# Patient Record
Sex: Male | Born: 1990 | Race: White | Hispanic: No | Marital: Single | Smoking: Current every day smoker
Health system: Southern US, Community
[De-identification: ages and names within clinical notes are randomized; demographics above are authoritative.]

## PROBLEM LIST (undated history)

## (undated) DIAGNOSIS — J45909 Unspecified asthma, uncomplicated: Secondary | ICD-10-CM

---
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
## 2011-08-22 ENCOUNTER — Ambulatory Visit: Payer: Self-pay | Admitting: Internal Medicine

## 2015-01-18 ENCOUNTER — Encounter: Payer: Self-pay | Admitting: Emergency Medicine

## 2015-01-18 ENCOUNTER — Emergency Department
Admission: EM | Admit: 2015-01-18 | Discharge: 2015-01-18 | Disposition: A | Discharge status: Home / Self Care | Payer: Self-pay | Attending: Emergency Medicine | Admitting: Emergency Medicine

## 2015-01-18 ENCOUNTER — Emergency Department: Payer: Self-pay

## 2015-01-18 DIAGNOSIS — Y9389 Activity, other specified: Secondary | ICD-10-CM | POA: Insufficient documentation

## 2015-01-18 DIAGNOSIS — Y92410 Unspecified street and highway as the place of occurrence of the external cause: Secondary | ICD-10-CM | POA: Insufficient documentation

## 2015-01-18 DIAGNOSIS — S8391XA Sprain of unspecified site of right knee, initial encounter: Secondary | ICD-10-CM

## 2015-01-18 DIAGNOSIS — Y998 Other external cause status: Secondary | ICD-10-CM | POA: Insufficient documentation

## 2015-01-18 DIAGNOSIS — F17200 Nicotine dependence, unspecified, uncomplicated: Secondary | ICD-10-CM | POA: Insufficient documentation

## 2015-01-18 HISTORY — DX: Unspecified asthma, uncomplicated: J45.909

## 2015-01-18 MED ORDER — OXYCODONE-ACETAMINOPHEN 5-325 MG PO TABS
1.0000 | ORAL_TABLET | Freq: Once | ORAL | Status: AC
  Administered 2015-01-18: 1 via ORAL
  Filled 2015-01-18: qty 1

## 2015-01-18 MED ORDER — IBUPROFEN 800 MG PO TABS: 800.0000 mg | ORAL_TABLET | Freq: Three times a day (TID) | ORAL | Status: DC | PRN

## 2015-01-18 MED ORDER — IBUPROFEN 800 MG PO TABS
800.0000 mg | ORAL_TABLET | Freq: Once | ORAL | Status: AC
  Administered 2015-01-18: 800 mg via ORAL
  Filled 2015-01-18: qty 1

## 2015-01-18 MED ORDER — TRAMADOL HCL 50 MG PO TABS: 50.0000 mg | ORAL_TABLET | Freq: Four times a day (QID) | ORAL | Status: AC | PRN
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
## 2015-01-18 NOTE — Discharge Instructions (Signed)
Wearing knee support for 3-5 days as needed. How to Use a Knee Brace A knee brace is a device that you wear to support your knee, especially if the knee is healing after an injury or surgery. There are several types of knee braces. Some are designed to prevent an injury (prophylactic brace). These are often worn during sports. Others support an injured knee (functional brace) or keep it still while it heals (rehabilitative brace). People with severe arthritis of the knee may benefit from a brace that takes some pressure off the knee (unloader brace). Most knee braces are made from a combination of cloth and metal or plastic.  You may need to wear a knee brace to:  Relieve knee pain.  Help your knee support your weight (improve stability).  Help you walk farther (improve mobility).  Prevent injury.  Support your knee while it heals from surgery or from an injury. RISKS AND COMPLICATIONS Generally, knee braces are very safe to wear. However, problems may occur, including:  Skin irritation that may lead to infection.  Making your condition worse if you wear the brace in the wrong way. HOW TO USE A KNEE BRACE Different braces will have different instructions for use. Your health care provider will tell you or show you:  How to put on your brace.  How to adjust the brace.  When and how often to wear the brace.  How to remove the brace.  If you will need any assistive devices in addition to the brace, such as crutches or a cane. In general, your brace should:  Have the hinge of the brace line up with the bend of your knee.  Have straps, hooks, or tapes that fasten snugly around your leg.  Not feel too tight or too loose. HOW TO CARE FOR A KNEE BRACE  Check your brace often for signs of damage, such as loose connections or attachments. Your knee brace may get damaged or wear out during normal use.  Wash the fabric parts of your brace with soap and water.  Read the insert that  comes with your brace for other specific care instructions. SEEK MEDICAL CARE IF:  Your knee brace is too loose or too tight and you cannot adjust it.  Your knee brace causes skin redness, swelling, bruising, or irritation.  Your knee brace is not helping.  Your knee brace is making your knee pain worse.   This information is not intended to replace advice given to you by your health care provider. Make sure you discuss any questions you have with your health care provider.   Document Released: 03/29/2003 Document Revised: 09/27/2014 Document Reviewed: 05/01/2014 Elsevier Interactive Patient Education [employer]2016 Elsevier Inc.
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
## 2015-03-26 ENCOUNTER — Emergency Department
Admission: EM | Admit: 2015-03-26 | Discharge: 2015-03-26 | Disposition: A | Discharge status: Home / Self Care | Payer: Self-pay | Attending: Emergency Medicine | Admitting: Emergency Medicine

## 2015-03-26 ENCOUNTER — Emergency Department: Payer: Self-pay

## 2015-03-26 ENCOUNTER — Encounter: Payer: Self-pay | Admitting: Emergency Medicine

## 2015-03-26 DIAGNOSIS — K2970 Gastritis, unspecified, without bleeding: Secondary | ICD-10-CM | POA: Insufficient documentation

## 2015-03-26 DIAGNOSIS — F17200 Nicotine dependence, unspecified, uncomplicated: Secondary | ICD-10-CM | POA: Insufficient documentation

## 2015-03-26 LAB — URINALYSIS COMPLETE WITH MICROSCOPIC (ARMC ONLY)
BACTERIA UA: NONE SEEN
BILIRUBIN URINE: NEGATIVE
GLUCOSE, UA: NEGATIVE mg/dL
Hgb urine dipstick: NEGATIVE
KETONES UR: NEGATIVE mg/dL
Leukocytes, UA: NEGATIVE
NITRITE: NEGATIVE
Protein, ur: NEGATIVE mg/dL
SPECIFIC GRAVITY, URINE: 1.028 (ref 1.005–1.030)
Squamous Epithelial / LPF: NONE SEEN
pH: 6 (ref 5.0–8.0)

## 2015-03-26 LAB — COMPREHENSIVE METABOLIC PANEL
ALBUMIN: 4.2 g/dL (ref 3.5–5.0)
ALK PHOS: 79 U/L (ref 38–126)
ALT: 38 U/L (ref 17–63)
AST: 22 U/L (ref 15–41)
Anion gap: 10 (ref 5–15)
BILIRUBIN TOTAL: 0.6 mg/dL (ref 0.3–1.2)
BUN: 14 mg/dL (ref 6–20)
CALCIUM: 9.1 mg/dL (ref 8.9–10.3)
CO2: 27 mmol/L (ref 22–32)
CREATININE: 1.09 mg/dL (ref 0.61–1.24)
Chloride: 103 mmol/L (ref 101–111)
GFR calc Af Amer: >60 mL/min (ref >60)
GLUCOSE: 96 mg/dL (ref 65–99)
Potassium: 3.8 mmol/L (ref 3.5–5.1)
Sodium: 140 mmol/L (ref 135–145)
TOTAL PROTEIN: 7.3 g/dL (ref 6.5–8.1)

## 2015-03-26 LAB — CBC
HCT: 53.7 % — ABNORMAL HIGH (ref 40.0–52.0)
Hemoglobin: 17.9 g/dL (ref 13.0–18.0)
MCH: 30 pg (ref 26.0–34.0)
MCHC: 33.4 g/dL (ref 32.0–36.0)
MCV: 89.9 fL (ref 80.0–100.0)
PLATELETS: 229 10*3/uL (ref 150–440)
RBC: 5.97 MIL/uL — ABNORMAL HIGH (ref 4.40–5.90)
RDW: 12.8 % (ref 11.5–14.5)
WBC: 11.3 10*3/uL — AB (ref 3.8–10.6)

## 2015-03-26 LAB — LIPASE, BLOOD: Lipase: 15 U/L (ref 11–51)

## 2015-03-26 MED ORDER — ONDANSETRON HCL 4 MG/2ML IJ SOLN
4.0000 mg | Freq: Once | INTRAMUSCULAR | Status: AC
  Administered 2015-03-26: 4 mg via INTRAVENOUS
  Filled 2015-03-26: qty 2

## 2015-03-26 MED ORDER — ONDANSETRON HCL 4 MG PO TABS: 4.0000 mg | ORAL_TABLET | Freq: Three times a day (TID) | ORAL | Status: DC | PRN

## 2015-03-26 MED ORDER — IOHEXOL 350 MG/ML SOLN
100.0000 mL | Freq: Once | INTRAVENOUS | Status: AC | PRN
  Administered 2015-03-26: 100 mL via INTRAVENOUS
  Filled 2015-03-26: qty 100

## 2015-03-26 MED ORDER — DICYCLOMINE HCL 20 MG PO TABS: 20.0000 mg | ORAL_TABLET | Freq: Three times a day (TID) | ORAL | Status: DC | PRN

## 2015-03-26 MED ORDER — PANTOPRAZOLE SODIUM 20 MG PO TBEC: 20.0000 mg | DELAYED_RELEASE_TABLET | Freq: Every day | ORAL | Status: DC

## 2015-03-26 MED ORDER — IOHEXOL 240 MG/ML SOLN
25.0000 mL | Freq: Once | INTRAMUSCULAR | Status: AC | PRN
  Administered 2015-03-26: 25 mL via ORAL
  Filled 2015-03-26: qty 25

## 2015-03-26 MED ORDER — KETOROLAC TROMETHAMINE 30 MG/ML IJ SOLN
30.0000 mg | Freq: Once | INTRAMUSCULAR | Status: AC
  Administered 2015-03-26: 30 mg via INTRAVENOUS
  Filled 2015-03-26: qty 1

## 2015-03-26 NOTE — ED Notes (Addendum)
Pt reports RUQ pain since Saturday; reports vomiting with pain and diarrhea. Pt reports taking pepto bismol without relief.

## 2015-03-26 NOTE — Discharge Instructions (Signed)
Gastritis, Adult Gastritis is soreness and swelling (inflammation) of the lining of the stomach. Gastritis can develop as a sudden onset (acute) or long-term (chronic) condition. If gastritis is not treated, it can lead to stomach bleeding and ulcers. CAUSES  Gastritis occurs when the stomach lining is weak or damaged. Digestive juices from the stomach then inflame the weakened stomach lining. The stomach lining may be weak or damaged due to viral or bacterial infections. One common bacterial infection is the Helicobacter pylori infection. Gastritis can also result from excessive alcohol consumption, taking certain medicines, or having too much acid in the stomach.  SYMPTOMS  In some cases, there are no symptoms. When symptoms are present, they may include:  Pain or a burning sensation in the upper abdomen.  Nausea.  Vomiting.  An uncomfortable feeling of fullness after eating. DIAGNOSIS  Your caregiver may suspect you have gastritis based on your symptoms and a physical exam. To determine the cause of your gastritis, your caregiver may perform the following:  Blood or stool tests to check for the H pylori bacterium.  Gastroscopy. A thin, flexible tube (endoscope) is passed down the esophagus and into the stomach. The endoscope has a light and camera on the end. Your caregiver uses the endoscope to view the inside of the stomach.  Taking a tissue sample (biopsy) from the stomach to examine under a microscope. TREATMENT  Depending on the cause of your gastritis, medicines may be prescribed. If you have a bacterial infection, such as an H pylori infection, antibiotics may be given. If your gastritis is caused by too much acid in the stomach, H2 blockers or antacids may be given. Your caregiver may recommend that you stop taking aspirin, ibuprofen, or other nonsteroidal anti-inflammatory drugs (NSAIDs). HOME CARE INSTRUCTIONS  Only take over-the-counter or prescription medicines as directed by  your caregiver.  If you were given antibiotic medicines, take them as directed. Finish them even if you start to feel better.  Drink enough fluids to keep your urine clear or pale yellow.  Avoid foods and drinks that make your symptoms worse, such as:  Caffeine or alcoholic drinks.  Chocolate.  Peppermint or mint flavorings.  Garlic and onions.  Spicy foods.  Citrus fruits, such as oranges, lemons, or limes.  Tomato-based foods such as sauce, chili, salsa, and pizza.  Fried and fatty foods.  Eat small, frequent meals instead of large meals. SEEK IMMEDIATE MEDICAL CARE IF:   You have black or dark red stools.  You vomit blood or material that looks like coffee grounds.  You are unable to keep fluids down.  Your abdominal pain gets worse.  You have a fever.  You do not feel better after 1 week.  You have any other questions or concerns. MAKE SURE YOU:  Understand these instructions.  Will watch your condition.  Will get help right away if you are not doing well or get worse.   This information is not intended to replace advice given to you by your health care provider. Make sure you discuss any questions you have with your health care provider.   Document Released: 12/31/2000 Document Revised: 07/08/2011 Document Reviewed: 02/19/2011 Elsevier Interactive Patient Education [employer]  Please return immediately if condition worsens. Please contact her primary physician or the physician you were given for referral. If you have any specialist physicians involved in her treatment and plan please also contact them. Thank you for using [facility]. Try to avoid nonsteroidal medications like  ibuprofen, Aleve, Naprosyn.

## 2015-03-26 NOTE — ED Provider Notes (Signed)
Time Seen: Approximately 1405  I have reviewed the triage notes  Chief Complaint: Abdominal Pain   History of Present Illness: Nathan Pena is a 25 y.o. male who presents with intermittent right-sided abdominal pain generally around food. Patient states that when he eats he feels nauseated occasional vomiting and gets some loose watery stool. He is not aware of any fever and currently states that his pain is more toward the right lower quadrant. Since states he took some Pepto-Bismol without relief at home. He denies any recent travel or antibiotic therapy. He denies any melena or hematochezia. He states some current nausea without persistent vomiting and states most of his issues usually seem to be around eating. The patient had half a bag of potato chips at 1300 and states that's when the pain seemed to be at its maximum. Past Medical History  Diagnosis Date  . Asthma     There are no active problems to display for this patient.   History reviewed. No pertinent past surgical history.  History reviewed. No pertinent past surgical history.  Current Outpatient Rx  Name  Route  Sig  Dispense  Refill  . ibuprofen (ADVIL,MOTRIN) 800 MG tablet   Oral   Take 1 tablet (800 mg total) by mouth every 8 (eight) hours as needed for moderate pain.   15 tablet   0   . traMADol (ULTRAM) 50 MG tablet   Oral   Take 1 tablet (50 mg total) by mouth every 6 (six) hours as needed.   20 tablet   0     Allergies:  Review of patient's allergies indicates no known allergies.  Family History: No family history on file.  Social History: Social History  Substance Use Topics  . Smoking status: Current Every Day Smoker -- 0.50 packs/day  . Smokeless tobacco: None  . Alcohol Use: Yes     Comment: occasional     Review of Systems:   10 point review of systems was performed and was otherwise negative:  Constitutional: No fever Eyes: No visual disturbances ENT: No sore throat, ear  pain Cardiac: No chest pain Respiratory: No shortness of breath, wheezing, or stridor Abdomen: Constant right-sided pain with occasional exacerbations. Endocrine: No weight loss, No night sweats Extremities: No peripheral edema, cyanosis Skin: No rashes, easy bruising Neurologic: No focal weakness, trouble with speech or swollowing Urologic: No dysuria, Hematuria, or urinary frequency No testicular pain or masses  Physical Exam:  ED Triage Vitals  Enc Vitals Group     BP 03/26/15 1144 139/81 mmHg     Pulse Rate 03/26/15 1144 67     Resp 03/26/15 1144 17     Temp 03/26/15 1144 98.2 F (36.8 C)     Temp Source 03/26/15 1144 Oral     SpO2 03/26/15 1144 95 %     Weight 03/26/15 1144 198 lb (89.812 kg)     Height 03/26/15 1144 5\' 9"  (1.753 m)     Head Cir --      Peak Flow --      Pain Score 03/26/15 1147 5     Pain Loc --      Pain Edu? --      Excl. in GC? --     General: Awake , Alert , and Oriented times 3; GCS 15 Head: Normal cephalic , atraumatic Eyes: Pupils equal , round, reactive to light Nose/Throat: No nasal drainage, patent upper airway without erythema or exudate.  Neck: Supple, Full range  of motion, No anterior adenopathy or palpable thyroid masses Lungs: Clear to ascultation without wheezes , rhonchi, or rales Heart: Regular rate, regular rhythm without murmurs , gallops , or rubs Abdomen: Patient has some mild tenderness over McBurney's point without rebound, guarding , or rigidity; bowel sounds positive and symmetric in all 4 quadrants. No organomegaly .       Negative Murphy's sign Extremities: 2 plus symmetric pulses. No edema, clubbing or cyanosis Neurologic: normal ambulation, Motor symmetric without deficits, sensory intact Skin: warm, dry, no rashes   Labs:   All laboratory work was reviewed including any pertinent negatives or positives listed below:  Labs Reviewed  CBC - Abnormal; Notable for the following:    WBC 11.3 (*)    RBC 5.97 (*)    HCT  53.7 (*)    All other components within normal limits  URINALYSIS COMPLETEWITH MICROSCOPIC ([facility] ONLY) - Abnormal; Notable for the following:    Color, Urine YELLOW (*)    APPearance CLEAR (*)    All other components within normal limits  LIPASE, BLOOD  COMPREHENSIVE METABOLIC PANEL   review laboratory work shows a negative urinalysis and a slightly elevated white blood cell count   Radiology:    EXAM: CT ABDOMEN AND PELVIS WITH CONTRAST  TECHNIQUE: Multidetector CT imaging of the abdomen and pelvis was performed using the standard protocol following bolus administration of intravenous contrast.  CONTRAST: OMNIPAQUE IOHEXOL 350 MG/ML SOLN  COMPARISON: None.  FINDINGS: Lower chest: Lung bases are clear.  Hepatobiliary: No focal hepatic lesion. No biliary duct dilatation. Gallbladder is normal. Common bile duct is normal.  Pancreas: Pancreas is normal. No ductal dilatation. No pancreatic inflammation.  Spleen: Normal spleen  Adrenals/urinary tract: Adrenal glands and kidneys are normal. The ureters and bladder normal.  Stomach/Bowel: Stomach, small bowel, appendix, and cecum are normal. The colon and rectosigmoid colon are normal.  Vascular/Lymphatic: Abdominal aorta is normal caliber. There is no retroperitoneal or periportal lymphadenopathy. No pelvic lymphadenopathy.  Reproductive: Prostate normal.  Other: No free fluid.  Musculoskeletal: No aggressive osseous lesion.  IMPRESSION: 1. Normal gallbladder and appendix. 2. No Ureterolithiasis or obstructive     I personally reviewed the radiologic studies    ED Course:  Patient's stay here was uneventful and with history of lower abdominal pain differential includes Differential diagnosis includes but is not exclusive to acute appendicitis, renal colic, testicular torsion, urinary tract infection, prostatitis,  diverticulitis, small bowel obstruction, colitis, abdominal aortic aneurysm,  gastroenteritis, etc. Given the patient's current clinical presentation and objective findings I felt it was a nonsurgical presentation and it was unlikely this was acute cholecystitis or acute appendicitis. Patient has some mild GI irritation and this may be gastroenteritis versus gastritis. Patient does smoke and is recently been on ibuprofen and took Pepto-Bismol at home. Patient was prescribed proton next, Zofran, and Bentyl on an outpatient basis and was referred to general medicine unassigned. He's been advised drink plenty of fluids and return here especially if he has blood in his stool, bloody emesis, high fever, or any other new concerns.    Assessment:  Acute unspecified abdominal pain     Plan: * Patient was advised to return immediately if condition worsens. Patient was advised to follow up with their primary care physician or other specialized physicians involved in their outpatient care Outpatient management            Jennye Moccasin, MD 03/26/15 [phone]

## 2015-03-26 NOTE — ED Notes (Signed)
Right upper abd pain that increases with food.  Tender to palpation upper right side. Ate .5 bag of potato chips approx. 1300

## 2015-06-24 ENCOUNTER — Emergency Department
Admission: EM | Admit: 2015-06-24 | Discharge: 2015-06-24 | Disposition: A | Discharge status: Home / Self Care | Payer: Self-pay | Attending: Emergency Medicine | Admitting: Emergency Medicine

## 2015-06-24 ENCOUNTER — Emergency Department: Payer: Self-pay

## 2015-06-24 ENCOUNTER — Encounter: Payer: Self-pay | Admitting: Emergency Medicine

## 2015-06-24 DIAGNOSIS — Y999 Unspecified external cause status: Secondary | ICD-10-CM | POA: Insufficient documentation

## 2015-06-24 DIAGNOSIS — F17200 Nicotine dependence, unspecified, uncomplicated: Secondary | ICD-10-CM | POA: Insufficient documentation

## 2015-06-24 DIAGNOSIS — Y939 Activity, unspecified: Secondary | ICD-10-CM | POA: Insufficient documentation

## 2015-06-24 DIAGNOSIS — J45909 Unspecified asthma, uncomplicated: Secondary | ICD-10-CM | POA: Insufficient documentation

## 2015-06-24 DIAGNOSIS — W11XXXA Fall on and from ladder, initial encounter: Secondary | ICD-10-CM | POA: Insufficient documentation

## 2015-06-24 DIAGNOSIS — S93401A Sprain of unspecified ligament of right ankle, initial encounter: Secondary | ICD-10-CM | POA: Insufficient documentation

## 2015-06-24 DIAGNOSIS — Z79899 Other long term (current) drug therapy: Secondary | ICD-10-CM | POA: Insufficient documentation

## 2015-06-24 DIAGNOSIS — S29019A Strain of muscle and tendon of unspecified wall of thorax, initial encounter: Secondary | ICD-10-CM

## 2015-06-24 DIAGNOSIS — S39012A Strain of muscle, fascia and tendon of lower back, initial encounter: Secondary | ICD-10-CM | POA: Insufficient documentation

## 2015-06-24 DIAGNOSIS — W19XXXA Unspecified fall, initial encounter: Secondary | ICD-10-CM

## 2015-06-24 DIAGNOSIS — Y929 Unspecified place or not applicable: Secondary | ICD-10-CM | POA: Insufficient documentation

## 2015-06-24 DIAGNOSIS — S29011A Strain of muscle and tendon of front wall of thorax, initial encounter: Secondary | ICD-10-CM | POA: Insufficient documentation

## 2015-06-24 MED ORDER — IBUPROFEN 800 MG PO TABS: 800.0000 mg | ORAL_TABLET | Freq: Three times a day (TID) | ORAL | Status: DC | PRN

## 2015-06-24 MED ORDER — IBUPROFEN 800 MG PO TABS
800.0000 mg | ORAL_TABLET | Freq: Once | ORAL | Status: AC
  Administered 2015-06-24: 800 mg via ORAL
  Filled 2015-06-24: qty 1

## 2015-06-24 MED ORDER — CYCLOBENZAPRINE HCL 5 MG PO TABS: 5.0000 mg | ORAL_TABLET | Freq: Three times a day (TID) | ORAL | Status: DC | PRN

## 2015-06-24 NOTE — Discharge Instructions (Signed)
Ankle Sprain °An ankle sprain is an injury to the strong, fibrous tissues (ligaments) that hold the bones of your ankle joint together.  °CAUSES °An ankle sprain is usually caused by a fall or by twisting your ankle. Ankle sprains most commonly occur when you step on the outer edge of your foot, and your ankle turns inward. People who participate in sports are more prone to these types of injuries.  °SYMPTOMS  °· Pain in your ankle. The pain may be present at rest or only when you are trying to stand or walk. °· Swelling. °· Bruising. Bruising may develop immediately or within 1 to 2 days after your injury. °· Difficulty standing or walking, particularly when turning corners or changing directions. °DIAGNOSIS  °Your caregiver will ask you details about your injury and perform a physical exam of your ankle to determine if you have an ankle sprain. During the physical exam, your caregiver will press on and apply pressure to specific areas of your foot and ankle. Your caregiver will try to move your ankle in certain ways. An X-ray exam may be done to be sure a bone was not broken or a ligament did not separate from one of the bones in your ankle (avulsion fracture).  °TREATMENT  °Certain types of braces can help stabilize your ankle. Your caregiver can make a recommendation for this. Your caregiver may recommend the use of medicine for pain. If your sprain is severe, your caregiver may refer you to a surgeon who helps to restore function to parts of your skeletal system (orthopedist) or a physical therapist. °HOME CARE INSTRUCTIONS  °· Apply ice to your injury for 1-2 days or as directed by your caregiver. Applying ice helps to reduce inflammation and pain. °· Put ice in a plastic bag. °· Place a towel between your skin and the bag. °· Leave the ice on for 15-20 minutes at a time, every 2 hours while you are awake. °· Only take over-the-counter or prescription medicines for pain, discomfort, or fever as directed by  your caregiver. °· Elevate your injured ankle above the level of your heart as much as possible for 2-3 days. °· If your caregiver recommends crutches, use them as instructed. Gradually put weight on the affected ankle. Continue to use crutches or a cane until you can walk without feeling pain in your ankle. °· If you have a plaster splint, wear the splint as directed by your caregiver. Do not rest it on anything harder than a pillow for the first 24 hours. Do not put weight on it. Do not get it wet. You may take it off to take a shower or bath. °· You may have been given an elastic bandage to wear around your ankle to provide support. If the elastic bandage is too tight (you have numbness or tingling in your foot or your foot becomes cold and blue), adjust the bandage to make it comfortable. °· If you have an air splint, you may blow more air into it or let air out to make it more comfortable. You may take your splint off at night and before taking a shower or bath. Wiggle your toes in the splint several times per day to decrease swelling. °SEEK MEDICAL CARE IF:  °· You have rapidly increasing bruising or swelling. °· Your toes feel extremely cold or you lose feeling in your foot. °· Your pain is not relieved with medicine. °SEEK IMMEDIATE MEDICAL CARE IF: °· Your toes are numb or blue. °·   You have severe pain that is increasing. MAKE SURE YOU:   Understand these instructions.  Will watch your condition.  Will get help right away if you are not doing well or get worse.   This information is not intended to replace advice given to you by your health care provider. Make sure you discuss any questions you have with your health care provider.   Document Released: 01/06/2005 Document Revised: 01/27/2014 Document Reviewed: 01/18/2011 Elsevier Interactive Patient Education 2016 [employer].  Low Back Strain With Rehab A strain is an injury in which a tendon or muscle is torn. The muscles and tendons of  the lower back are vulnerable to strains. However, these muscles and tendons are very strong and require a great force to be injured. Strains are classified into three categories. Grade 1 strains cause pain, but the tendon is not lengthened. Grade 2 strains include a lengthened ligament, due to the ligament being stretched or partially ruptured. With grade 2 strains there is still function, although the function may be decreased. Grade 3 strains involve a complete tear of the tendon or muscle, and function is usually impaired. SYMPTOMS   Pain in the lower back.  Pain that affects one side more than the other.  Pain that gets worse with movement and may be felt in the hip, buttocks, or back of the thigh.  Muscle spasms of the muscles in the back.  Swelling along the muscles of the back.  Loss of strength of the back muscles.  Crackling sound (crepitation) when the muscles are touched. CAUSES  Lower back strains occur when a force is placed on the muscles or tendons that is greater than they can handle. Common causes of injury include:  Prolonged overuse of the muscle-tendon units in the lower back, usually from incorrect posture.  A single violent injury or force applied to the back. RISK INCREASES WITH:  Sports that involve twisting forces on the spine or a lot of bending at the waist (football, rugby, weightlifting, bowling, golf, tennis, speed skating, racquetball, swimming, running, gymnastics, diving).  Poor strength and flexibility.  Failure to warm up properly before activity.  Family history of lower back pain or disk disorders.  Previous back injury or surgery (especially fusion).  Poor posture with lifting, especially heavy objects.  Prolonged sitting, especially with poor posture. PREVENTION   Learn and use proper posture when sitting or lifting (maintain proper posture when sitting, lift using the knees and legs, not at the waist).  Warm up and stretch properly  before activity.  Allow for adequate recovery between workouts.  Maintain physical fitness:  Strength, flexibility, and endurance.  Cardiovascular fitness. PROGNOSIS  If treated properly, lower back strains usually heal within 6 weeks. RELATED COMPLICATIONS   Recurring symptoms, resulting in a chronic problem.  Chronic inflammation, scarring, and partial muscle-tendon tear.  Delayed healing or resolution of symptoms.  Prolonged disability. TREATMENT  Treatment first involves the use of ice and medicine, to reduce pain and inflammation. The use of strengthening and stretching exercises may help reduce pain with activity. These exercises may be performed at home or with a therapist. Severe injuries may require referral to a therapist for further evaluation and treatment, such as ultrasound. Your caregiver may advise that you wear a back brace or corset, to help reduce pain and discomfort. Often, prolonged bed rest results in greater harm then benefit. Corticosteroid injections may be recommended. However, these should be reserved for the most serious cases. It is important  to avoid using your back when lifting objects. At night, sleep on your back on a firm mattress with a pillow placed under your knees. If non-surgical treatment is unsuccessful, surgery may be needed.  MEDICATION   If pain medicine is needed, nonsteroidal anti-inflammatory medicines (aspirin and ibuprofen), or other minor pain relievers (acetaminophen), are often advised.  Do not take pain medicine for 7 days before surgery.  Prescription pain relievers may be given, if your caregiver thinks they are needed. Use only as directed and only as much as you need.  Ointments applied to the skin may be helpful.  Corticosteroid injections may be given by your caregiver. These injections should be reserved for the most serious cases, because they may only be given a certain number of times. HEAT AND COLD  Cold treatment  (icing) should be applied for 10 to 15 minutes every 2 to 3 hours for inflammation and pain, and immediately after activity that aggravates your symptoms. Use ice packs or an ice massage.  Heat treatment may be used before performing stretching and strengthening activities prescribed by your caregiver, physical therapist, or athletic trainer. Use a heat pack or a warm water soak. SEEK MEDICAL CARE IF:   Symptoms get worse or do not improve in 2 to 4 weeks, despite treatment.  You develop numbness, weakness, or loss of bowel or bladder function.  New, unexplained symptoms develop. (Drugs used in treatment may produce side effects.) EXERCISES  RANGE OF MOTION (ROM) AND STRETCHING EXERCISES - Low Back Strain Most people with lower back pain will find that their symptoms get worse with excessive bending forward (flexion) or arching at the lower back (extension). The exercises which will help resolve your symptoms will focus on the opposite motion.  Your physician, physical therapist or athletic trainer will help you determine which exercises will be most helpful to resolve your lower back pain. Do not complete any exercises without first consulting with your caregiver. Discontinue any exercises which make your symptoms worse until you speak to your caregiver.  If you have pain, numbness or tingling which travels down into your buttocks, leg or foot, the goal of the therapy is for these symptoms to move closer to your back and eventually resolve. Sometimes, these leg symptoms will get better, but your lower back pain may worsen. This is typically an indication of progress in your rehabilitation. Be very alert to any changes in your symptoms and the activities in which you participated in the 24 hours prior to the change. Sharing this information with your caregiver will allow him/her to most efficiently treat your condition.  These exercises may help you when beginning to rehabilitate your injury. Your  symptoms may resolve with or without further involvement from your physician, physical therapist or athletic trainer. While completing these exercises, remember:  Restoring tissue flexibility helps normal motion to return to the joints. This allows healthier, less painful movement and activity.  An effective stretch should be held for at least 30 seconds.  A stretch should never be painful. You should only feel a gentle lengthening or release in the stretched tissue. FLEXION RANGE OF MOTION AND STRETCHING EXERCISES: STRETCH - Flexion, Single Knee to Chest   Lie on a firm bed or floor with both legs extended in front of you.  Keeping one leg in contact with the floor, bring your opposite knee to your chest. Hold your leg in place by either grabbing behind your thigh or at your knee.  Pull until you  feel a gentle stretch in your lower back. Hold __________ seconds.  Slowly release your grasp and repeat the exercise with the opposite side. Repeat __________ times. Complete this exercise __________ times per day.  STRETCH - Flexion, Double Knee to Chest   Lie on a firm bed or floor with both legs extended in front of you.  Keeping one leg in contact with the floor, bring your opposite knee to your chest.  Tense your stomach muscles to support your back and then lift your other knee to your chest. Hold your legs in place by either grabbing behind your thighs or at your knees.  Pull both knees toward your chest until you feel a gentle stretch in your lower back. Hold __________ seconds.  Tense your stomach muscles and slowly return one leg at a time to the floor. Repeat __________ times. Complete this exercise __________ times per day.  STRETCH - Low Trunk Rotation  Lie on a firm bed or floor. Keeping your legs in front of you, bend your knees so they are both pointed toward the ceiling and your feet are flat on the floor.  Extend your arms out to the side. This will stabilize your upper  body by keeping your shoulders in contact with the floor.  Gently and slowly drop both knees together to one side until you feel a gentle stretch in your lower back. Hold for __________ seconds.  Tense your stomach muscles to support your lower back as you bring your knees back to the starting position. Repeat the exercise to the other side. Repeat __________ times. Complete this exercise __________ times per day  EXTENSION RANGE OF MOTION AND FLEXIBILITY EXERCISES: STRETCH - Extension, Prone on Elbows   Lie on your stomach on the floor, a bed will be too soft. Place your palms about shoulder width apart and at the height of your head.  Place your elbows under your shoulders. If this is too painful, stack pillows under your chest.  Allow your body to relax so that your hips drop lower and make contact more completely with the floor.  Hold this position for __________ seconds.  Slowly return to lying flat on the floor. Repeat __________ times. Complete this exercise __________ times per day.  RANGE OF MOTION - Extension, Prone Press Ups  Lie on your stomach on the floor, a bed will be too soft. Place your palms about shoulder width apart and at the height of your head.  Keeping your back as relaxed as possible, slowly straighten your elbows while keeping your hips on the floor. You may adjust the placement of your hands to maximize your comfort. As you gain motion, your hands will come more underneath your shoulders.  Hold this position __________ seconds.  Slowly return to lying flat on the floor. Repeat __________ times. Complete this exercise __________ times per day.  RANGE OF MOTION- Quadruped, Neutral Spine   Assume a hands and knees position on a firm surface. Keep your hands under your shoulders and your knees under your hips. You may place padding under your knees for comfort.  Drop your head and point your tail bone toward the ground below you. This will round out your lower  back like an angry cat. Hold this position for __________ seconds.  Slowly lift your head and release your tail bone so that your back sags into a large arch, like an old horse.  Hold this position for __________ seconds.  Repeat this until you feel limber  in your lower back.  Now, find your "sweet spot." This will be the most comfortable position somewhere between the two previous positions. This is your neutral spine. Once you have found this position, tense your stomach muscles to support your lower back.  Hold this position for __________ seconds. Repeat __________ times. Complete this exercise __________ times per day.  STRENGTHENING EXERCISES - Low Back Strain These exercises may help you when beginning to rehabilitate your injury. These exercises should be done near your "sweet spot." This is the neutral, low-back arch, somewhere between fully rounded and fully arched, that is your least painful position. When performed in this safe range of motion, these exercises can be used for people who have either a flexion or extension based injury. These exercises may resolve your symptoms with or without further involvement from your physician, physical therapist or athletic trainer. While completing these exercises, remember:   Muscles can gain both the endurance and the strength needed for everyday activities through controlled exercises.  Complete these exercises as instructed by your physician, physical therapist or athletic trainer. Increase the resistance and repetitions only as guided.  You may experience muscle soreness or fatigue, but the pain or discomfort you are trying to eliminate should never worsen during these exercises. If this pain does worsen, stop and make certain you are following the directions exactly. If the pain is still present after adjustments, discontinue the exercise until you can discuss the trouble with your caregiver. STRENGTHENING - Deep Abdominals, Pelvic  Tilt  Lie on a firm bed or floor. Keeping your legs in front of you, bend your knees so they are both pointed toward the ceiling and your feet are flat on the floor.  Tense your lower abdominal muscles to press your lower back into the floor. This motion will rotate your pelvis so that your tail bone is scooping upwards rather than pointing at your feet or into the floor.  With a gentle tension and even breathing, hold this position for __________ seconds. Repeat __________ times. Complete this exercise __________ times per day.  STRENGTHENING - Abdominals, Crunches   Lie on a firm bed or floor. Keeping your legs in front of you, bend your knees so they are both pointed toward the ceiling and your feet are flat on the floor. Cross your arms over your chest.  Slightly tip your chin down without bending your neck.  Tense your abdominals and slowly lift your trunk high enough to just clear your shoulder blades. Lifting higher can put excessive stress on the lower back and does not further strengthen your abdominal muscles.  Control your return to the starting position. Repeat __________ times. Complete this exercise __________ times per day.  STRENGTHENING - Quadruped, Opposite UE/LE Lift   Assume a hands and knees position on a firm surface. Keep your hands under your shoulders and your knees under your hips. You may place padding under your knees for comfort.  Find your neutral spine and gently tense your abdominal muscles so that you can maintain this position. Your shoulders and hips should form a rectangle that is parallel with the floor and is not twisted.  Keeping your trunk steady, lift your right hand no higher than your shoulder and then your left leg no higher than your hip. Make sure you are not holding your breath. Hold this position __________ seconds.  Continuing to keep your abdominal muscles tense and your back steady, slowly return to your starting position. Repeat with the  opposite arm and leg. Repeat __________ times. Complete this exercise __________ times per day.  STRENGTHENING - Lower Abdominals, Double Knee Lift  Lie on a firm bed or floor. Keeping your legs in front of you, bend your knees so they are both pointed toward the ceiling and your feet are flat on the floor.  Tense your abdominal muscles to brace your lower back and slowly lift both of your knees until they come over your hips. Be certain not to hold your breath.  Hold __________ seconds. Using your abdominal muscles, return to the starting position in a slow and controlled manner. Repeat __________ times. Complete this exercise __________ times per day.  POSTURE AND BODY MECHANICS CONSIDERATIONS - Low Back Strain Keeping correct posture when sitting, standing or completing your activities will reduce the stress put on different body tissues, allowing injured tissues a chance to heal and limiting painful experiences. The following are general guidelines for improved posture. Your physician or physical therapist will provide you with any instructions specific to your needs. While reading these guidelines, remember:  The exercises prescribed by your provider will help you have the flexibility and strength to maintain correct postures.  The correct posture provides the best environment for your joints to work. All of your joints have less wear and tear when properly supported by a spine with good posture. This means you will experience a healthier, less painful body.  Correct posture must be practiced with all of your activities, especially prolonged sitting and standing. Correct posture is as important when doing repetitive low-stress activities (typing) as it is when doing a single heavy-load activity (lifting). RESTING POSITIONS Consider which positions are most painful for you when choosing a resting position. If you have pain with flexion-based activities (sitting, bending, stooping, squatting),  choose a position that allows you to rest in a less flexed posture. You would want to avoid curling into a fetal position on your side. If your pain worsens with extension-based activities (prolonged standing, working overhead), avoid resting in an extended position such as sleeping on your stomach. Most people will find more comfort when they rest with their spine in a more neutral position, neither too rounded nor too arched. Lying on a non-sagging bed on your side with a pillow between your knees, or on your back with a pillow under your knees will often provide some relief. Keep in mind, being in any one position for a prolonged period of time, no matter how correct your posture, can still lead to stiffness. PROPER SITTING POSTURE In order to minimize stress and discomfort on your spine, you must sit with correct posture. Sitting with good posture should be effortless for a healthy body. Returning to good posture is a gradual process. Many people can work toward this most comfortably by using various supports until they have the flexibility and strength to maintain this posture on their own. When sitting with proper posture, your ears will fall over your shoulders and your shoulders will fall over your hips. You should use the back of the chair to support your upper back. Your lower back will be in a neutral position, just slightly arched. You may place a small pillow or folded towel at the base of your lower back for support.  When working at a desk, create an environment that supports good, upright posture. Without extra support, muscles tire, which leads to excessive strain on joints and other tissues. Keep these recommendations in mind: CHAIR:  A chair should be  able to slide under your desk when your back makes contact with the back of the chair. This allows you to work closely.  The chair's height should allow your eyes to be level with the upper part of your monitor and your hands to be slightly  lower than your elbows. BODY POSITION  Your feet should make contact with the floor. If this is not possible, use a foot rest.  Keep your ears over your shoulders. This will reduce stress on your neck and lower back. INCORRECT SITTING POSTURES  If you are feeling tired and unable to assume a healthy sitting posture, do not slouch or slump. This puts excessive strain on your back tissues, causing more damage and pain. Healthier options include:  Using more support, like a lumbar pillow.  Switching tasks to something that requires you to be upright or walking.  Talking a brief walk.  Lying down to rest in a neutral-spine position. PROLONGED STANDING WHILE SLIGHTLY LEANING FORWARD  When completing a task that requires you to lean forward while standing in one place for a long time, place either foot up on a stationary 2-4 inch high object to help maintain the best posture. When both feet are on the ground, the lower back tends to lose its slight inward curve. If this curve flattens (or becomes too large), then the back and your other joints will experience too much stress, tire more quickly, and can cause pain. CORRECT STANDING POSTURES Proper standing posture should be assumed with all daily activities, even if they only take a few moments, like when brushing your teeth. As in sitting, your ears should fall over your shoulders and your shoulders should fall over your hips. You should keep a slight tension in your abdominal muscles to brace your spine. Your tailbone should point down to the ground, not behind your body, resulting in an over-extended swayback posture.  INCORRECT STANDING POSTURES  Common incorrect standing postures include a forward head, locked knees and/or an excessive swayback. WALKING Walk with an upright posture. Your ears, shoulders and hips should all line-up. PROLONGED ACTIVITY IN A FLEXED POSITION When completing a task that requires you to bend forward at your waist or  lean over a low surface, try to find a way to stabilize 3 out of 4 of your limbs. You can place a hand or elbow on your thigh or rest a knee on the surface you are reaching across. This will provide you more stability so that your muscles do not fatigue as quickly. By keeping your knees relaxed, or slightly bent, you will also reduce stress across your lower back. CORRECT LIFTING TECHNIQUES DO :   Assume a wide stance. This will provide you more stability and the opportunity to get as close as possible to the object which you are lifting.  Tense your abdominals to brace your spine. Bend at the knees and hips. Keeping your back locked in a neutral-spine position, lift using your leg muscles. Lift with your legs, keeping your back straight.  Test the weight of unknown objects before attempting to lift them.  Try to keep your elbows locked down at your sides in order get the best strength from your shoulders when carrying an object.  Always ask for help when lifting heavy or awkward objects. INCORRECT LIFTING TECHNIQUES DO NOT:   Lock your knees when lifting, even if it is a small object.  Bend and twist. Pivot at your feet or move your feet when needing to  change directions.  Assume that you can safely pick up even a paper clip without proper posture.   This information is not intended to replace advice given to you by your health care provider. Make sure you discuss any questions you have with your health care provider.   Document Released: 01/06/2005 Document Revised: 01/27/2014 Document Reviewed: 04/20/2008 [employer] Interactive Patient Education 2016 [employer]  Mid-Back Strain With Rehab  A strain is an injury in which a tendon or muscle is torn. The muscles and tendons of the mid-back are vulnerable to strains. However, these muscles and tendons are very strong and require a great force to be injured. The muscles of the mid-back are responsible for stabilizing the spinal column, as  well as spinal twisting (rotation). Strains are classified into three categories. Grade 1 strains cause pain, but the tendon is not lengthened. Grade 2 strains include a lengthened ligament, due to the ligament being stretched or partially ruptured. With grade 2 strains there is still function, although the function may be decreased. Grade 3 strains involve a complete tear of the tendon or muscle, and function is usually impaired. SYMPTOMS   Pain in the middle of the back.  Pain that may affect only one side, and is worse with movement.  Muscle spasms, and often swelling in the back.  Loss of strength of the back muscles.  Crackling sound (crepitation) when the muscles are touched. CAUSES  Mid-back strains occur when a force is placed on the muscles or tendons that is greater than they can handle. Common causes of injury include:  Ongoing overuse of the muscle-tendon units in the middle back, usually from incorrect body posture.  A single violent injury or force applied to the back. RISK INCREASES WITH:  Sports that involve twisting forces on the spine or a lot of bending at the waist (football, rugby, weightlifting, bowling, golf, tennis, speed skating, racquetball, swimming, running, gymnastics, diving).  Poor strength and flexibility.  Failure to warm up properly before activity.  Family history of low back pain or disk disorders.  Previous back injury or surgery (especially fusion). PREVENTION  Learn and use proper sports technique.  Warm up and stretch properly before activity.  Allow for adequate recovery between workouts.  Maintain physical fitness:  Strength, flexibility, and endurance.  Cardiovascular fitness. PROGNOSIS  If treated properly, mid-back strains usually heal within 6 weeks. RELATED COMPLICATIONS   Frequently recurring symptoms, resulting in a chronic problem. Properly treating the problem the first time decreases frequency of recurrence.  Chronic  inflammation, scarring, and partial muscle-tendon tear.  Delayed healing or resolution of symptoms, especially if activity is resumed too soon.  Prolonged disability. TREATMENT Treatment first involves the use of ice and medicine, to reduce pain and inflammation. As the pain begins to subside, you may begin strengthening and stretching exercises to improve body posture and sport technique. These exercises may be performed at home or with a therapist. Severe injuries may require referral to a therapist for further evaluation and treatment, such as ultrasound. Corticosteroid injections may be given to help reduce inflammation. Biofeedback (watching monitors of your body processes) and psychotherapy may also be prescribed. Prolonged bed rest is felt to do more harm than good. Massage may help break the muscle spasms. Sometimes, an injection of cortisone, with or without local anesthetics, may be given to help relieve the pain and spasms. MEDICATION   If pain medicine is needed, nonsteroidal anti-inflammatory medicines (aspirin and ibuprofen), or other minor pain relievers (acetaminophen),  are often advised.  Do not take pain medicine for 7 days before surgery.  Prescription pain relievers may be given, if your caregiver thinks they are needed. Use only as directed and only as much as you need.  Ointments applied to the skin may be helpful.  Corticosteroid injections may be given by your caregiver. These injections should be reserved for the most serious cases, because they may only be given a certain number of times. HEAT AND COLD:   Cold treatment (icing) should be applied for 10 to 15 minutes every 2 to 3 hours for inflammation and pain, and immediately after activity that aggravates your symptoms. Use ice packs or an ice massage.  Heat treatment may be used before performing stretching and strengthening activities prescribed by your caregiver, physical therapist, or athletic trainer. Use a heat  pack or a warm water soak. SEEK IMMEDIATE MEDICAL CARE IF:  Symptoms get worse or do not improve in 2 to 4 weeks, despite treatment.  You develop numbness, weakness, or loss of bowel or bladder function.  New, unexplained symptoms develop. (Drugs used in treatment may produce side effects.) EXERCISES RANGE OF MOTION (ROM) AND STRETCHING EXERCISES - Mid-Back Strain These exercises may help you when beginning to rehabilitate your injury. In order to successfully resolve your symptoms, you must improve your posture. These exercises are designed to help reduce the forward-head and rounded-shoulder posture which contributes to this condition. Your symptoms may resolve with or without further involvement from your physician, physical therapist or athletic trainer. While completing these exercises, remember:   Restoring tissue flexibility helps normal motion to return to the joints. This allows healthier, less painful movement and activity.  An effective stretch should be held for at least 30 seconds.  A stretch should never be painful. You should only feel a gentle lengthening or release in the stretched tissue. STRETCH - Axial Extension  Stand or sit on a firm surface. Assume a good posture: chest up, shoulders drawn back, stomach muscles slightly tense, knees unlocked (if standing) and feet hip width apart.  Slowly retract your chin, so your head slides back and your chin slightly lowers. Continue to look straight ahead.  You should feel a gentle stretch in the back of your head. Be certain not to feel an aggressive stretch since this can cause headaches later.  Hold for __________ seconds. Repeat __________ times. Complete this exercise __________ times per day. RANGE OF MOTION- Upper Thoracic Extension  Sit on a firm chair with a high back. Assume a good posture: chest up, shoulders drawn back, abdominal muscles slightly tense, and feet hip width apart. Place a small pillow or folded towel  in the curve of your lower back, if you are having difficulty maintaining good posture.  Gently brace your neck with your hands, allowing your arms to rest on your chest.  Continue to support your neck and slowly extend your back over the chair. You will feel a stretch across your upper back.  Hold __________ seconds. Slowly return to the starting position. Repeat __________ times. Complete this exercise __________ times per day. RANGE OF MOTION- Mid-Thoracic Extension  Roll a towel so that it is about 4 inches in diameter.  Position the towel lengthwise. Lay on the towel so that your spine, but not your shoulder blades, are supported.  You should feel your mid-back arching toward the floor. To increase the stretch, extend your arms away from your body.  Hold for __________ seconds. Repeat exercise __________ times,  __________ times per day. STRENGTHENING EXERCISES - Mid-Back Strain These exercises may help you when beginning to rehabilitate your injury. They may resolve your symptoms with or without further involvement from your physician, physical therapist or athletic trainer. While completing these exercises, remember:   Muscles can gain both the endurance and the strength needed for everyday activities through controlled exercises.  Complete these exercises as instructed by your physician, physical therapist or athletic trainer. Increase the resistance and repetitions only as guided by your caregiver.  You may experience muscle soreness or fatigue, but the pain or discomfort you are trying to eliminate should never worsen during these exercises. If this pain does worsen, stop and make certain you are following the directions exactly. If the pain is still present after adjustments, discontinue the exercise until you can discuss the trouble with your caregiver. STRENGTHENING - Quadruped, Opposite UE/LE Lift  Assume a hands and knees position on a firm surface. Keep your hands under  your shoulders and your knees under your hips. You may place padding under your knees for comfort.  Find your neutral spine and gently tense your abdominal muscles so that you can maintain this position. Your shoulders and hips should form a rectangle that is parallel with the floor and is not twisted.  Keeping your trunk steady, lift your right hand no higher than your shoulder and then your left leg no higher than your hip. Make sure you are not holding your breath. Hold this position __________ seconds.  Continuing to keep your abdominal muscles tense and your back steady, slowly return to your starting position. Repeat with the opposite arm and leg. Repeat __________ times. Complete this exercise __________ times per day.  STRENGTH - Shoulder Extensors  Secure a rubber exercise band or tubing to a fixed object (table, pole) so that it is at the height of your shoulders when you are either standing, or sitting on a firm armless chair.  With a thumbs-up grip, grasp an end of the band in each hand. Straighten your elbows and lift your hands straight in front of you at shoulder height. Step back away from the secured end of band, until it becomes tense.  Squeezing your shoulder blades together, pull your hands down to the sides of your thighs. Do not allow your hands to go behind you.  Hold for __________ seconds. Slowly ease the tension on the band, as you reverse the directions and return to the starting position. Repeat __________ times. Complete this exercise __________ times per day.  STRENGTH - Horizontal Abductors Choose one of the two positions to complete this exercise. Prone: lying on stomach:  Lie on your stomach on a firm surface so that your right / left arm overhangs the edge. Rest your forehead on your opposite forearm. With your palm facing the floor and your elbow straight, hold a __________ weight in your hand.  Squeeze your right / left shoulder blade to your mid-back spine  and then slowly raise your arm to the height of the bed.  Hold for __________ seconds. Slowly reverse the directions and return to the starting position, controlling the weight as you lower your arm. Repeat __________ times. Complete this exercise __________ times per day. Standing:   Secure a rubber exercise band or tubing, so that it is at the height of your shoulders when you are either standing, or sitting on a firm armless chair.  Grasp an end of the band in each hand and have your palms face  each other. Straighten your elbows and lift your hands straight in front of you at shoulder height. Step back away from the secured end of band, until it becomes tense.  Squeeze your shoulder blades together. Keeping your elbows locked and your hands at shoulder height, spread your arms apart, forming a "T" shape with your body. Hold __________ seconds. Slowly ease the tension on the band, as you reverse the directions and return to the starting position. Repeat __________ times. Complete this exercise __________ times per day. STRENGTH - Scapular Retractors and External Rotators, Rowing  Secure a rubber exercise band or tubing, so that it is at the height of your shoulders when you are either standing, or sitting on a firm armless chair.  With a palm-down grip, grasp an end of the band in each hand. Straighten your elbows and lift your hands straight in front of you at shoulder height. Step back away from the secured end of band, until it becomes tense.  Step 1: Squeeze your shoulder blades together. Bending your elbows, draw your hands to your chest as if you are rowing a boat. At the end of this motion, your hands and elbow should be at shoulder height and your elbows should be out to your sides.  Step 2: Rotate your shoulder to raise your hands above your head. Your forearms should be vertical and your upper arms should be horizontal.  Hold for __________ seconds. Slowly ease the tension on the  band, as you reverse the directions and return to the starting position. Repeat __________ times. Complete this exercise __________ times per day.  POSTURE AND BODY MECHANICS CONSIDERATIONS - Mid-Back Strain Keeping correct posture when sitting, standing or completing your activities will reduce the stress put on different body tissues, allowing injured tissues a chance to heal and limiting painful experiences. The following are general guidelines for improved posture. Your physician or physical therapist will provide you with any instructions specific to your needs. While reading these guidelines, remember:  The exercises prescribed by your provider will help you have the flexibility and strength to maintain correct postures.  The correct posture provides the best environment for your joints to work. All of your joints have less wear and tear when properly supported by a spine with good posture. This means you will experience a healthier, less painful body.  Correct posture must be practiced with all of your activities, especially prolonged sitting and standing. Correct posture is as important when doing repetitive low-stress activities (typing) as it is when doing a single heavy-load activity (lifting). PROPER SITTING POSTURE In order to minimize stress and discomfort on your spine, you must sit with correct posture. Sitting with good posture should be effortless for a healthy body. Returning to good posture is a gradual process. Many people can work toward this most comfortably by using various supports until they have the flexibility and strength to maintain this posture on their own. When sitting with proper posture, your ears will fall over your shoulders and your shoulders will fall over your hips. You should use the back of the chair to support your upper back. Your lower back will be in a neutral position, just slightly arched. You may place a small pillow or folded towel at the base of your low  back for  support.  When working at a desk, create an environment that supports good, upright posture. Without extra support, muscles fatigue and lead to excessive strain on joints and other tissues. Keep these recommendations  in mind: CHAIR:  A chair should be able to slide under your desk when your back makes contact with the back of the chair. This allows you to work closely.  The chair's height should allow your eyes to be level with the upper part of your monitor and your hands to be slightly lower than your elbows. BODY POSITION  Your feet should make contact with the floor. If this is not possible, use a foot rest.  Keep your ears over your shoulders. This will reduce stress on your neck and lower back. INCORRECT SITTING POSTURES If you are feeling tired and unable to assume a healthy sitting posture, do not slouch or slump. This puts excessive strain on your back tissues, causing more damage and pain. Healthier options include:  Using more support, like a lumbar pillow.  Switching tasks to something that requires you to be upright or walking.  Talking a brief walk.  Lying down to rest in a neutral-spine position. CORRECT STANDING POSTURES Proper standing posture should be assumed with all daily activities, even if they only take a few moments, like when brushing your teeth. As in sitting, your ears should fall over your shoulders and your shoulders should fall over your hips. You should keep a slight tension in your abdominal muscles to brace your spine. Your tailbone should point down to the ground, not behind your body, resulting in an over-extended swayback posture.  INCORRECT STANDING POSTURES Common incorrect standing postures include a forward head, locked knees, and an excessive swayback. WALKING Walk with an upright posture. Your ears, shoulders and hips should all line-up. CORRECT LIFTING TECHNIQUES DO :   Assume a wide stance. This will provide you more stability and  the opportunity to get as close as possible to the object which you are lifting.  Tense your abdominals to brace your spine. Bend at the knees and hips. Keeping your back locked in a neutral-spine position, lift using your leg muscles. Lift with your legs, keeping your back straight.  Test the weight of unknown objects before attempting to lift them.  Try to keep your elbows locked down at your sides in order get the best strength from your shoulders when carrying an object.  Always ask for help when lifting heavy or awkward objects. INCORRECT LIFTING TECHNIQUES DO NOT:   Lock your knees when lifting, even if it is a small object.  Bend and twist. Pivot at your feet or move your feet when needing to change directions.  Assume that you can safely pick up even a paperclip without proper posture.   This information is not intended to replace advice given to you by your health care provider. Make sure you discuss any questions you have with your health care provider.   Document Released: 01/06/2005 Document Revised: 05/23/2014 Document Reviewed: 04/20/2008 [employer] Interactive Patient Education [employer]

## 2015-06-24 NOTE — ED Provider Notes (Signed)
CSN: [id number]     Arrival date & time 06/24/15  1525 History   First MD Initiated Contact with Patient 06/24/15 1558     Chief Complaint  Patient presents with  . Fall     (Consider location/radiation/quality/duration/timing/severity/associated sxs/prior Treatment) HPI  25 year old male presents to the emergency department for evaluation of right ankle and back pain. Just prior to arrival he was on a ladder approximately 6 feet high, fell backwards landing on his back. Denies any headache, loss of consciousness, nausea or vomiting. He complains of 8 out of 10 and mostly in his lower midline lumbar spine and along the lateral aspect of the right ankle. He has mild pain in his midline thoracic spine, denies any other pain or injuries to his body. He has not had any medications for pain. He is able to ambulate but with a limp due to pain in the right ankle. Denies any numbness or tingling in the upper or lower extremities.  Past Medical History  Diagnosis Date  . Asthma    History reviewed. No pertinent past surgical history. No family history on file. Social History  Substance Use Topics  . Smoking status: Current Every Day Smoker -- 0.50 packs/day  . Smokeless tobacco: None  . Alcohol Use: Yes     Comment: occasional    Review of Systems  Constitutional: Negative.  Negative for fever, chills, activity change and appetite change.  HENT: Negative for congestion, ear pain, mouth sores, rhinorrhea, sinus pressure, sore throat and trouble swallowing.   Eyes: Negative for photophobia, pain and discharge.  Respiratory: Negative for cough, chest tightness and shortness of breath.   Cardiovascular: Negative for chest pain and leg swelling.  Gastrointestinal: Negative for nausea, vomiting, abdominal pain, diarrhea and abdominal distention.  Genitourinary: Negative for dysuria and difficulty urinating.  Musculoskeletal: Positive for back pain, joint swelling and gait problem. Negative for  arthralgias and neck pain.  Skin: Negative for color change and rash.  Neurological: Negative for dizziness and headaches.  Hematological: Negative for adenopathy.  Psychiatric/Behavioral: Negative for behavioral problems and agitation.      Allergies  Review of patient's allergies indicates no known allergies.  Home Medications   Prior to Admission medications   Medication Sig Start Date End Date Taking? Authorizing Provider  cyclobenzaprine (FLEXERIL) 5 MG tablet Take 1 tablet (5 mg total) by mouth every 8 (eight) hours as needed for muscle spasms. 06/24/15   Evon Slackhomas C Gaines, PA-C  dicyclomine (BENTYL) 20 MG tablet Take 1 tablet (20 mg total) by mouth 3 (three) times daily as needed for spasms. 03/26/15   Jennye MoccasinBrian S Quigley, MD  ibuprofen (ADVIL,MOTRIN) 800 MG tablet Take 1 tablet (800 mg total) by mouth every 8 (eight) hours as needed for moderate pain. 01/18/15   Joni Reiningonald K Smith, PA-C  ibuprofen (ADVIL,MOTRIN) 800 MG tablet Take 1 tablet (800 mg total) by mouth every 8 (eight) hours as needed. 06/24/15   Evon Slackhomas C Gaines, PA-C  ondansetron (ZOFRAN) 4 MG tablet Take 1 tablet (4 mg total) by mouth every 8 (eight) hours as needed for nausea or vomiting. 03/26/15   Jennye MoccasinBrian S Quigley, MD  pantoprazole (PROTONIX) 20 MG tablet Take 1 tablet (20 mg total) by mouth daily. 03/26/15 03/25/16  Jennye MoccasinBrian S Quigley, MD  traMADol (ULTRAM) 50 MG tablet Take 1 tablet (50 mg total) by mouth every 6 (six) hours as needed. 01/18/15 01/18/16  Joni Reiningonald K Smith, PA-C   BP 142/92 mmHg  Pulse 74  Temp(Src) 97.9 F (  36.6 C) (Oral)  Resp 18  Ht 5\' 10"  (1.778 m)  Wt 90.719 kg  BMI 28.70 kg/m2  SpO2 98% Physical Exam  Constitutional: He is oriented to person, place, and time. He appears well-developed and well-nourished.  HENT:  Head: Normocephalic and atraumatic.  Eyes: Conjunctivae and EOM are normal. Pupils are equal, round, and reactive to light.  Neck: Normal range of motion. Neck supple.  Cardiovascular: Normal rate,  regular rhythm, normal heart sounds and intact distal pulses.   Pulmonary/Chest: Effort normal and breath sounds normal. No respiratory distress. He has no wheezes. He has no rales. He exhibits no tenderness.  Abdominal: Soft. Bowel sounds are normal. He exhibits no distension and no mass. There is no tenderness. There is no rebound and no guarding.  Musculoskeletal:  Examination cervical spine shows patient has no spinous process tenderness. There is no left and right paravertebral muscle tenderness he has no pain with neck range of motion.Examination of the upper extremity shows full range of motion with active and passive range of motion at tenderness palpation along the shoulders elbows and wrists and digits.  Examination of the thoracic and lumbar spine shows patient has mild spinous process tenderness mostly at the lower thoracic spine and lumbosacral junction. He has mild left and right paravertebral muscle tenderness along the lumbar spine. There is no pelvic tenderness or pain with compression. He has full range of motion of the left and right hips with internal and external rotation. He has no pain with knee range of motion. He is tender along the right lateral ankle to palpation along the lateral malleolus. There is mild swelling along the lateral ankle. He has good active ankle plantarflexion and dorsiflexion of the right ankle. 2+ dorsalis pedis pulses bilaterally. No skin breakdown noted.  Lymphadenopathy:    He has no cervical adenopathy.  Neurological: He is alert and oriented to person, place, and time. No cranial nerve deficit. Coordination normal.  Skin: Skin is warm and dry.  Psychiatric: He has a normal mood and affect. His behavior is normal. Judgment and thought content normal.    ED Course  Procedures (including critical care time) Labs Review Labs Reviewed - No data to display  Imaging Review Dg Thoracic Spine 2 View  06/24/2015  CLINICAL DATA:  Larey Seat off ladder today.  Thoracic back injury and pain. Initial encounter. EXAM: THORACIC SPINE 2 VIEWS COMPARISON:  None. FINDINGS: There is no evidence of thoracic spine fracture. Alignment is normal. No other significant bone abnormalities are identified. IMPRESSION: Negative. Electronically Signed   By: Myles Rosenthal M.D.   On: 06/24/2015 17:13   Dg Lumbar Spine Complete  06/24/2015  CLINICAL DATA:  Larey Seat off ladder today. Low back injury and pain. Initial encounter. EXAM: LUMBAR SPINE - COMPLETE 4+ VIEW COMPARISON:  None. FINDINGS: There is no evidence of lumbar spine fracture. Alignment is normal. Intervertebral disc spaces are maintained. No evidence of facet arthropathy or other bone lesions. IMPRESSION: Negative lumbar spine radiographs. Electronically Signed   By: Myles Rosenthal M.D.   On: 06/24/2015 17:14   Dg Ankle Complete Right  06/24/2015  CLINICAL DATA:  Larey Seat off ladder today. Lateral ankle pain and swelling. Initial encounter. EXAM: RIGHT ANKLE - COMPLETE 3+ VIEW COMPARISON:  None. FINDINGS: There is no evidence of acute fracture, dislocation, or joint effusion. There is no evidence of arthropathy or other focal bone abnormality. Soft tissues are unremarkable. IMPRESSION: No acute findings. Electronically Signed   By: Alver Sorrow.D.  On: 06/24/2015 16:59   I have personally reviewed and evaluated these images and lab results as part of my medical decision-making.   EKG Interpretation None      MDM   Final diagnoses:  Right ankle sprain, initial encounter  Fall, initial encounter  Lumbar strain, initial encounter  Thoracic myofascial strain, initial encounter    25 year old male with fall. No head injury, headache, loss of consciousness. Complaining of mid and lower back pain along with right ankle pain. X-rays negative in the thoracic spine, lumbar spine, right ankle. Patient's given crutches and an ankle brace. Ibuprofen and Flexeril as needed for pain. Follow-up with orthopedics if no improvement in 5-7  days. Return to the ER for any worsening symptoms urgent changes in his health.    Evon Slack, PA-C 06/24/15 1742  Jeanmarie Plant, MD 06/24/15 2233

## 2015-06-24 NOTE — ED Notes (Signed)
States he was coming down a ladder and missed a step  Fell approx 6 ft backwards. Landed on back  Having lower back pain  Also states his right foot got hung up in ladder  Right ankle swollen and tender positive pulses and good circulation

## 2015-11-05 ENCOUNTER — Encounter: Payer: Self-pay | Admitting: Emergency Medicine

## 2015-11-05 ENCOUNTER — Emergency Department: Payer: Self-pay

## 2015-11-05 ENCOUNTER — Emergency Department
Admission: EM | Admit: 2015-11-05 | Discharge: 2015-11-05 | Disposition: A | Discharge status: Home / Self Care | Payer: Self-pay | Attending: Emergency Medicine | Admitting: Emergency Medicine

## 2015-11-05 DIAGNOSIS — Z791 Long term (current) use of non-steroidal anti-inflammatories (NSAID): Secondary | ICD-10-CM | POA: Insufficient documentation

## 2015-11-05 DIAGNOSIS — F17200 Nicotine dependence, unspecified, uncomplicated: Secondary | ICD-10-CM | POA: Insufficient documentation

## 2015-11-05 DIAGNOSIS — Z79899 Other long term (current) drug therapy: Secondary | ICD-10-CM | POA: Insufficient documentation

## 2015-11-05 DIAGNOSIS — J45909 Unspecified asthma, uncomplicated: Secondary | ICD-10-CM | POA: Insufficient documentation

## 2015-11-05 DIAGNOSIS — Y939 Activity, unspecified: Secondary | ICD-10-CM | POA: Insufficient documentation

## 2015-11-05 DIAGNOSIS — Y92009 Unspecified place in unspecified non-institutional (private) residence as the place of occurrence of the external cause: Secondary | ICD-10-CM | POA: Insufficient documentation

## 2015-11-05 DIAGNOSIS — Y999 Unspecified external cause status: Secondary | ICD-10-CM | POA: Insufficient documentation

## 2015-11-05 DIAGNOSIS — S300XXA Contusion of lower back and pelvis, initial encounter: Secondary | ICD-10-CM | POA: Insufficient documentation

## 2015-11-05 DIAGNOSIS — W109XXA Fall (on) (from) unspecified stairs and steps, initial encounter: Secondary | ICD-10-CM | POA: Insufficient documentation

## 2015-11-05 MED ORDER — OXYCODONE-ACETAMINOPHEN 5-325 MG PO TABS: 1.0000 | ORAL_TABLET | ORAL | 0 refills | Status: DC | PRN

## 2015-11-05 MED ORDER — DIAZEPAM 2 MG PO TABS: 2.0000 mg | ORAL_TABLET | Freq: Three times a day (TID) | ORAL | 0 refills | Status: DC | PRN

## 2015-11-05 MED ORDER — DIAZEPAM 2 MG PO TABS
2.0000 mg | ORAL_TABLET | Freq: Once | ORAL | Status: AC
  Administered 2015-11-05: 2 mg via ORAL
  Filled 2015-11-05: qty 1

## 2015-11-05 MED ORDER — OXYCODONE-ACETAMINOPHEN 5-325 MG PO TABS
1.0000 | ORAL_TABLET | Freq: Once | ORAL | Status: AC
Start: 2015-11-05 — End: 2015-11-05
  Administered 2015-11-05: 1 via ORAL
  Filled 2015-11-05: qty 1

## 2015-11-05 NOTE — ED Provider Notes (Signed)
[facility] Emergency Department Provider Note  ____________________________________________   First MD Initiated Contact with Patient 11/05/15 1227     (approximate)  I have reviewed the triage vital signs and the nursing notes.   HISTORY  Chief Complaint Back Pain   HPI Nathan Pena is a 25 y.o. male is here with complaint of back pain after falling down steps last evening at home care as they were went. Patient states that he fell backwards landing on his lower back. He denies any paresthesias or hematuria. Patient is not taking any over-the-counter medication for his pain prior to his arrival in the emergency room. 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 Patient with his pain is 7 out of 10 at this time.   Past Medical History:  Diagnosis Date  . Asthma     There are no active problems to display for this patient.   History reviewed. No pertinent surgical history.  Prior to Admission medications   Medication Sig Start Date End Date Taking? Authorizing Provider  cyclobenzaprine (FLEXERIL) 5 MG tablet Take 1 tablet (5 mg total) by mouth every 8 (eight) hours as needed for muscle spasms. 06/24/15   Evon Slack, PA-C  diazepam (VALIUM) 2 MG tablet Take 1 tablet (2 mg total) by mouth every 8 (eight) hours as needed for muscle spasms. 11/05/15   Tommi Rumps, PA-C  dicyclomine (BENTYL) 20 MG tablet Take 1 tablet (20 mg total) by mouth 3 (three) times daily as needed for spasms. 03/26/15   Jennye Moccasin, MD  ibuprofen (ADVIL,MOTRIN) 800 MG tablet Take 1 tablet (800 mg total) by mouth every 8 (eight) hours as needed for moderate pain. 01/18/15   Joni Reining, PA-C  ibuprofen (ADVIL,MOTRIN) 800 MG tablet Take 1 tablet (800 mg total) by mouth every 8 (eight) hours as needed. 06/24/15   Evon Slack, PA-C  ondansetron  (ZOFRAN) 4 MG tablet Take 1 tablet (4 mg total) by mouth every 8 (eight) hours as needed for nausea or vomiting. 03/26/15   Jennye Moccasin, MD  oxyCODONE-acetaminophen (PERCOCET) 5-325 MG tablet Take 1 tablet by mouth every 4 (four) hours as needed for severe pain. 11/05/15   Tommi Rumps, PA-C  pantoprazole (PROTONIX) 20 MG tablet Take 1 tablet (20 mg total) by mouth daily. 03/26/15 03/25/16  Jennye Moccasin, MD  traMADol (ULTRAM) 50 MG tablet Take 1 tablet (50 mg total) by mouth every 6 (six) hours as needed. 01/18/15 01/18/16  Joni Reining, PA-C    Allergies Review of patient's allergies indicates no known allergies.  History reviewed. No pertinent family history.  Social History Social History  Substance Use Topics  . Smoking status: Current Every Day Smoker    Packs/day: 0.50  . Smokeless tobacco: Never Used  . Alcohol use Yes     Comment: occasional    Review of Systems Constitutional: No fever/chills Eyes: No visual changes. ENT: No trauma Cardiovascular: Denies chest pain. Respiratory: Denies shortness of breath. Gastrointestinal: No abdominal pain.  No nausea, no vomiting.  Genitourinary: Negative for hematuria Musculoskeletal: Positive for low back pain. Skin: Negative for rash. Neurological: Negative for headaches, focal weakness or numbness.  10-point ROS otherwise negative.  ____________________________________________   PHYSICAL EXAM:  VITAL SIGNS: ED Triage Vitals  Enc Vitals Group     BP 11/05/15  1212 132/74     Pulse Rate 11/05/15 1212 67     Resp 11/05/15 1212 16     Temp 11/05/15 1212 97.4 F (36.3 C)     Temp Source 11/05/15 1212 Oral     SpO2 11/05/15 1212 100 %     Weight --      Height --      Head Circumference --      Peak Flow --      Pain Score 11/05/15 1203 7     Pain Loc --      Pain Edu? --      Excl. in GC? --     Constitutional: Alert and oriented. Well appearing and in no acute distress. Eyes: Conjunctivae are normal.  PERRL. EOMI. Head: Atraumatic. Nose: No congestion/rhinnorhea. Neck: No stridor.  No cervical spine tenderness on palpation posteriorly. Cardiovascular: Normal rate, regular rhythm. Grossly normal heart sounds.  Good peripheral circulation. Respiratory: Normal respiratory effort.  No retractions. Lungs CTAB. Musculoskeletal: On examination of the back there is no gross deformity noted. There is moderate tenderness on palpation of the paravertebral muscles lumbar area approximately L5-S1. There is no gross deformity noted. There is tenderness on palpation of the vertebral bodies. No ecchymosis or abrasions were seen. Reflexes were 2+. Patient is ambulatory without assistance. Neurologic:  Normal speech and language. No gross focal neurologic deficits are appreciated. No gait instability. Skin:  Skin is warm, dry and intact. As noted above. Psychiatric: Mood and affect are normal. Speech and behavior are normal.  ____________________________________________   LABS (all labs ordered are listed, but only abnormal results are displayed)  Labs Reviewed - No data to display  RADIOLOGY Lumbar spine x-ray per radiologist is negative for fracture. I, Tommi Rumpshonda L Summers, personally viewed and evaluated these images (plain radiographs) as part of my medical decision making, as well as reviewing the written report by the radiologist.  ____________________________________________   PROCEDURES  Procedure(s) performed: None  Procedures  Critical Care performed: No  ____________________________________________   INITIAL IMPRESSION / ASSESSMENT AND PLAN / ED COURSE  Pertinent labs & imaging results that were available during my care of the patient were reviewed by me and considered in my medical decision making (see chart for details).    Clinical Course  Patient was given Percocet and Valium 2 mg while in the emergency room and was more called prior to discharge. Patient was made aware that  he did not have any fractures. Patient is to follow-up with his primary care doctor or Dr. Hyacinth MeekerMiller if any continued problems with his back. Patient was also given a note to remain out of work for 2 days and he acknowledges that he cannot drive while taking pain medication.   ____________________________________________   FINAL CLINICAL IMPRESSION(S) / ED DIAGNOSES  Final diagnoses:  Contusion of lower back, initial encounter      NEW MEDICATIONS STARTED DURING THIS VISIT:  Discharge Medication List as of 11/05/2015  2:37 PM    START taking these medications   Details  diazepam (VALIUM) 2 MG tablet Take 1 tablet (2 mg total) by mouth every 8 (eight) hours as needed for muscle spasms., Starting Mon 11/05/2015, Print    oxyCODONE-acetaminophen (PERCOCET) 5-325 MG tablet Take 1 tablet by mouth every 4 (four) hours as needed for severe pain., Starting Mon 11/05/2015, Print         Note:  This document was prepared using Dragon voice recognition software and may include unintentional dictation errors.  Tommi Rumps, PA-C 11/05/15 1532    Emily Filbert, MD 11/06/15 [phone]

## 2015-11-05 NOTE — ED Triage Notes (Signed)
Pt to ed with c/o back pain after falling down steps last night at home. Pt reports the steps were slick and that caused him to slip.

## 2016-05-13 IMAGING — CT CT ABD-PELV W/ CM
1 of 2 series · 15 of 32 positions shown, 19 images · IV contrast (omnipaque)
Comparison: None.

CLINICAL DATA: RIGHT upper quadrant pain since [REDACTED]. Vomiting
and diarrhea

EXAM:
CT ABDOMEN AND PELVIS WITH CONTRAST
TECHNIQUE: Multidetector CT imaging of the abdomen and pelvis was performed
using the standard protocol following bolus administration of
intravenous contrast.
CONTRAST:  100mL OMNIPAQUE IOHEXOL 350 MG/ML SOLN

[Series 2: routine abd pel with · axial · 0.89mm/px · z∈[-1112,-647]mm · 15 of 103 slices shown, 19 images]
[im 5/103  soft-tissue]
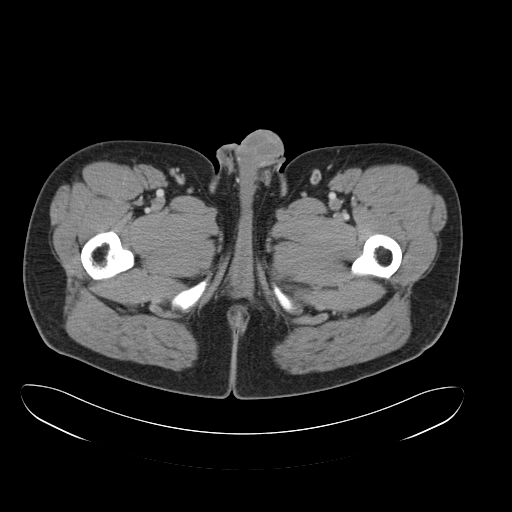
[im 5/103  bone]
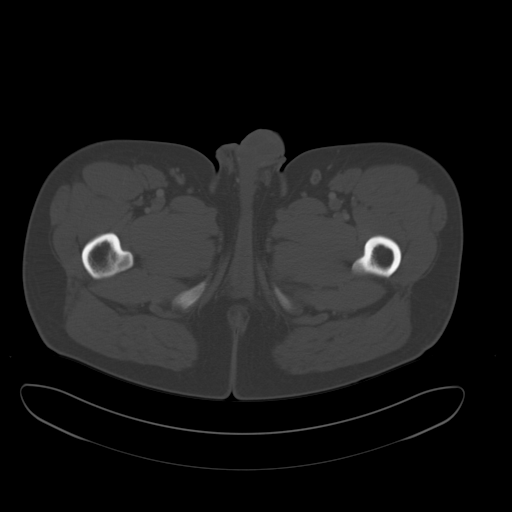
[im 13/103  soft-tissue]
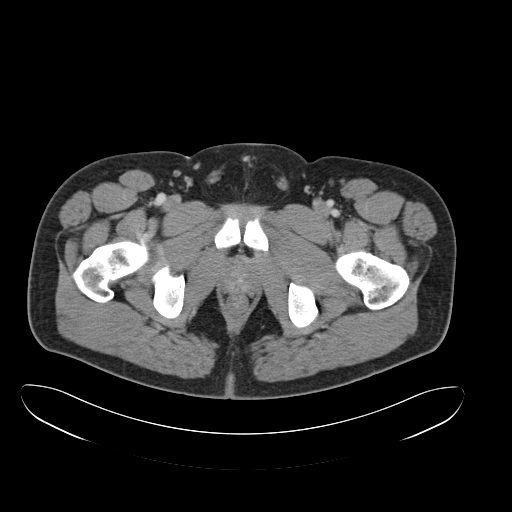
[im 22/103  soft-tissue]
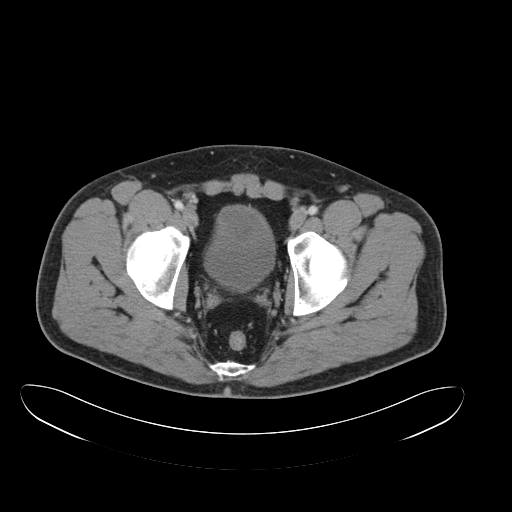
[im 30/103  soft-tissue]
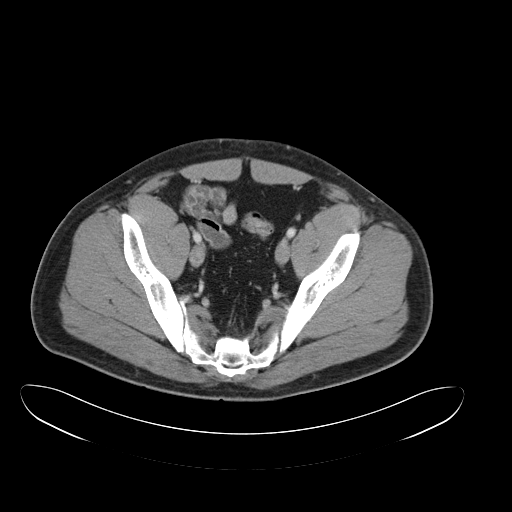
[im 35/103  soft-tissue]
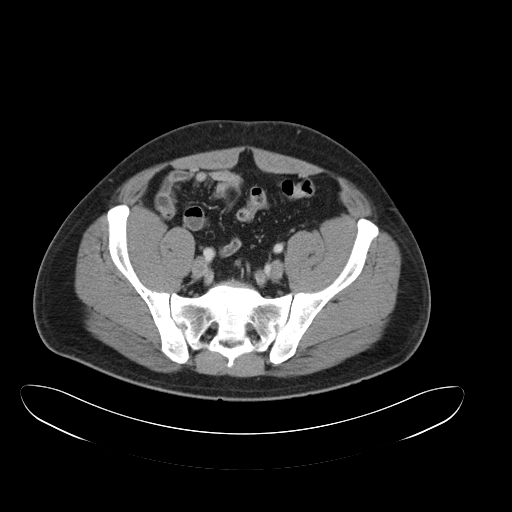
[im 43/103  soft-tissue]
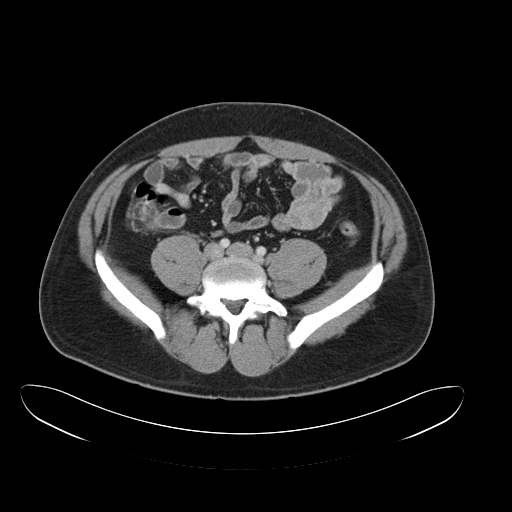
[im 52/103  soft-tissue]
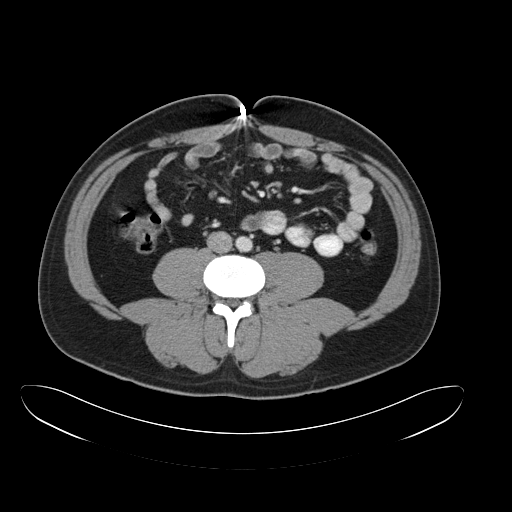
[im 60/103  soft-tissue]
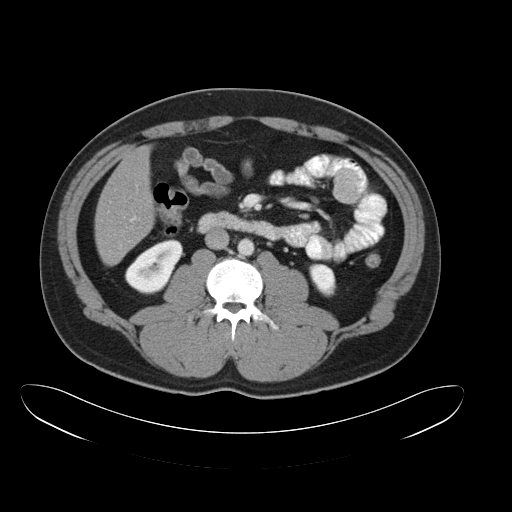
[im 69/103  soft-tissue]
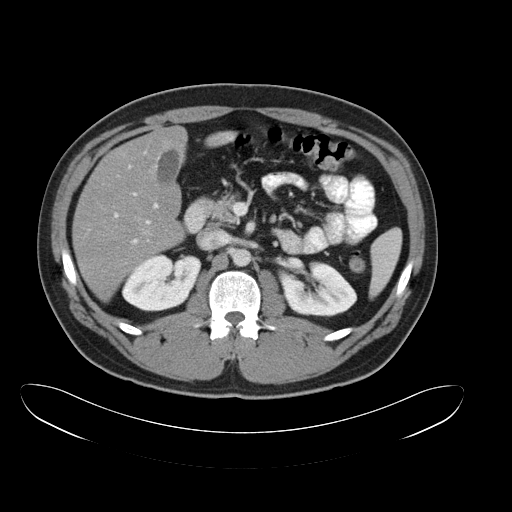
[im 69/103  bone]
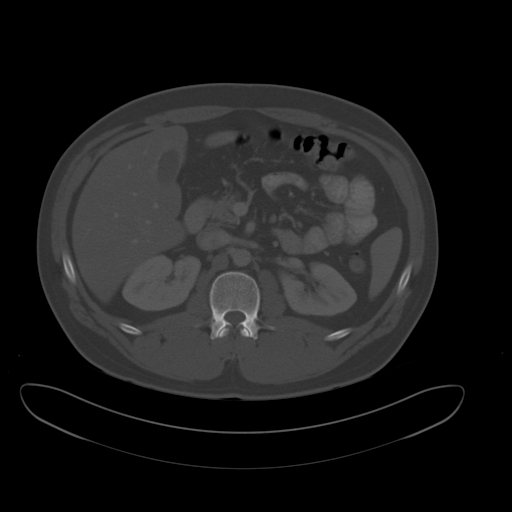
[im 73/103  soft-tissue]
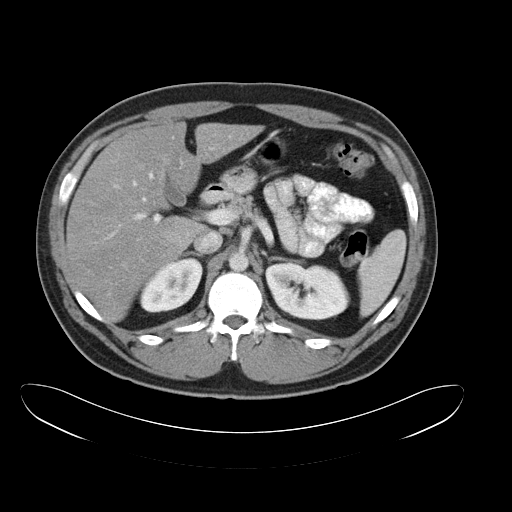
[im 81/103  soft-tissue]
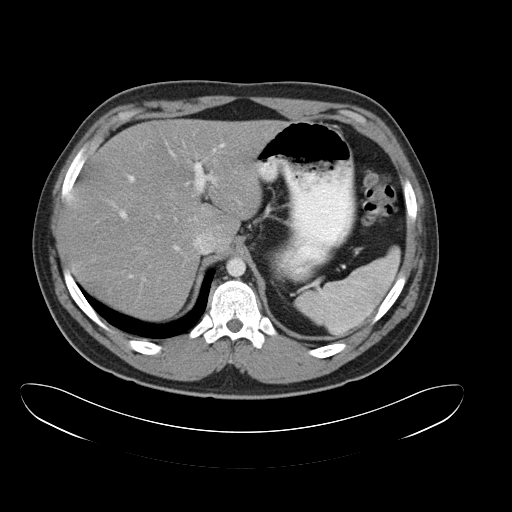
[im 86/103  lung]
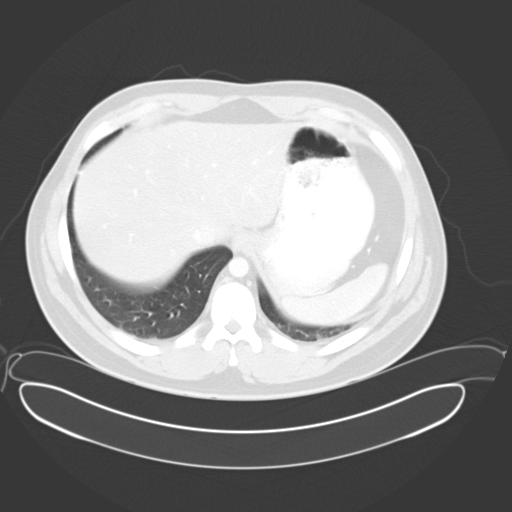
[im 90/103  soft-tissue]
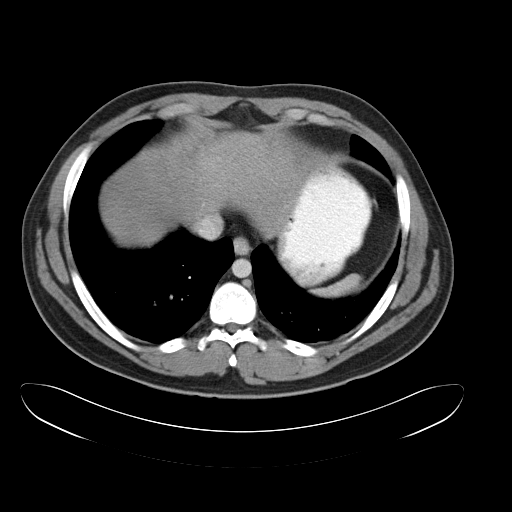
[im 90/103  lung]
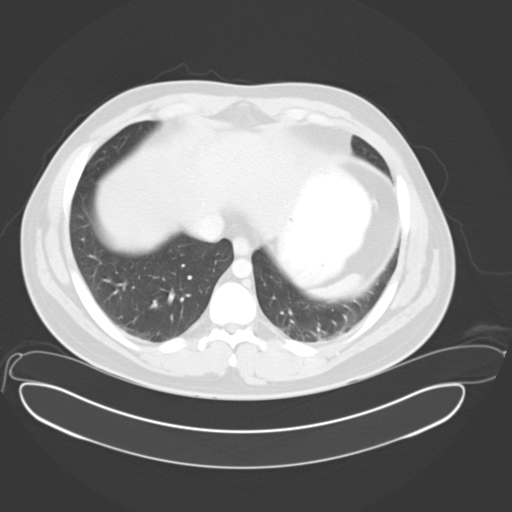
[im 94/103  lung]
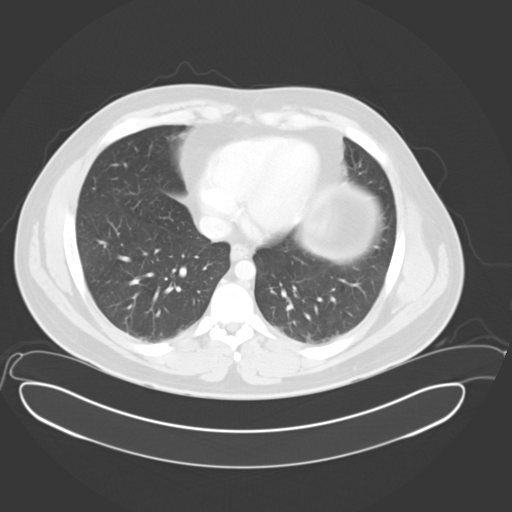
[im 98/103  soft-tissue]
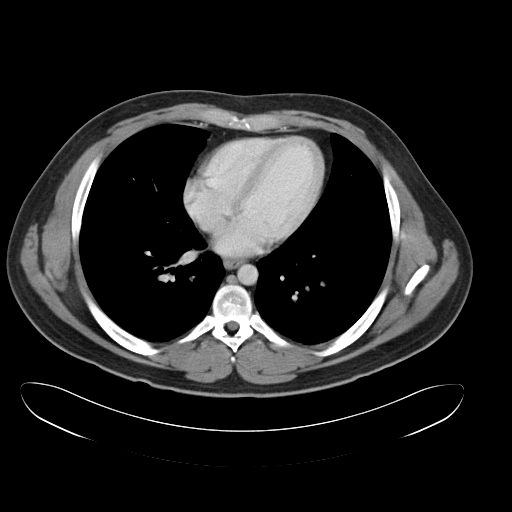
[im 98/103  lung]
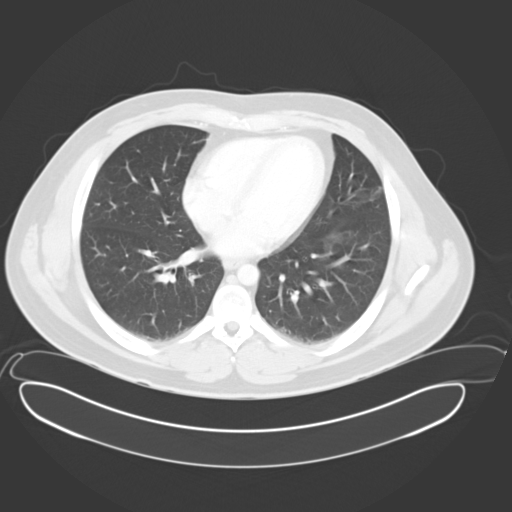

[15 of 32 positions shown; findings below may reference images not displayed]

FINDINGS: Lower chest: Lung bases are clear.

Hepatobiliary: No focal hepatic lesion. No biliary duct dilatation.
Gallbladder is normal. Common bile duct is normal.

Pancreas: Pancreas is normal. No ductal dilatation. No pancreatic
inflammation.

Spleen: Normal spleen

Adrenals/urinary tract: Adrenal glands and kidneys are normal. The
ureters and bladder normal.

Stomach/Bowel: Stomach, small bowel, appendix, and cecum are normal.
The colon and rectosigmoid colon are normal.

Vascular/Lymphatic: Abdominal aorta is normal caliber. There is no
retroperitoneal or periportal lymphadenopathy. No pelvic
lymphadenopathy.

Reproductive: Prostate normal.

Other: No free fluid.

Musculoskeletal: No aggressive osseous lesion.
IMPRESSION: 1. Normal gallbladder and appendix.
2. No Ureterolithiasis or obstructive

## 2016-08-11 IMAGING — CR DG ANKLE COMPLETE 3+V*R*
1 series · 3 of 3 positions shown · non-contrast
Comparison: None.

CLINICAL DATA: Fell off ladder today. Lateral ankle pain and
swelling. Initial encounter.

EXAM:
RIGHT ANKLE - COMPLETE 3+ VIEW

[Series 1: dg ankle complete right · 0.14mm/px · 3 of 3 slices shown]
[im 1/3]
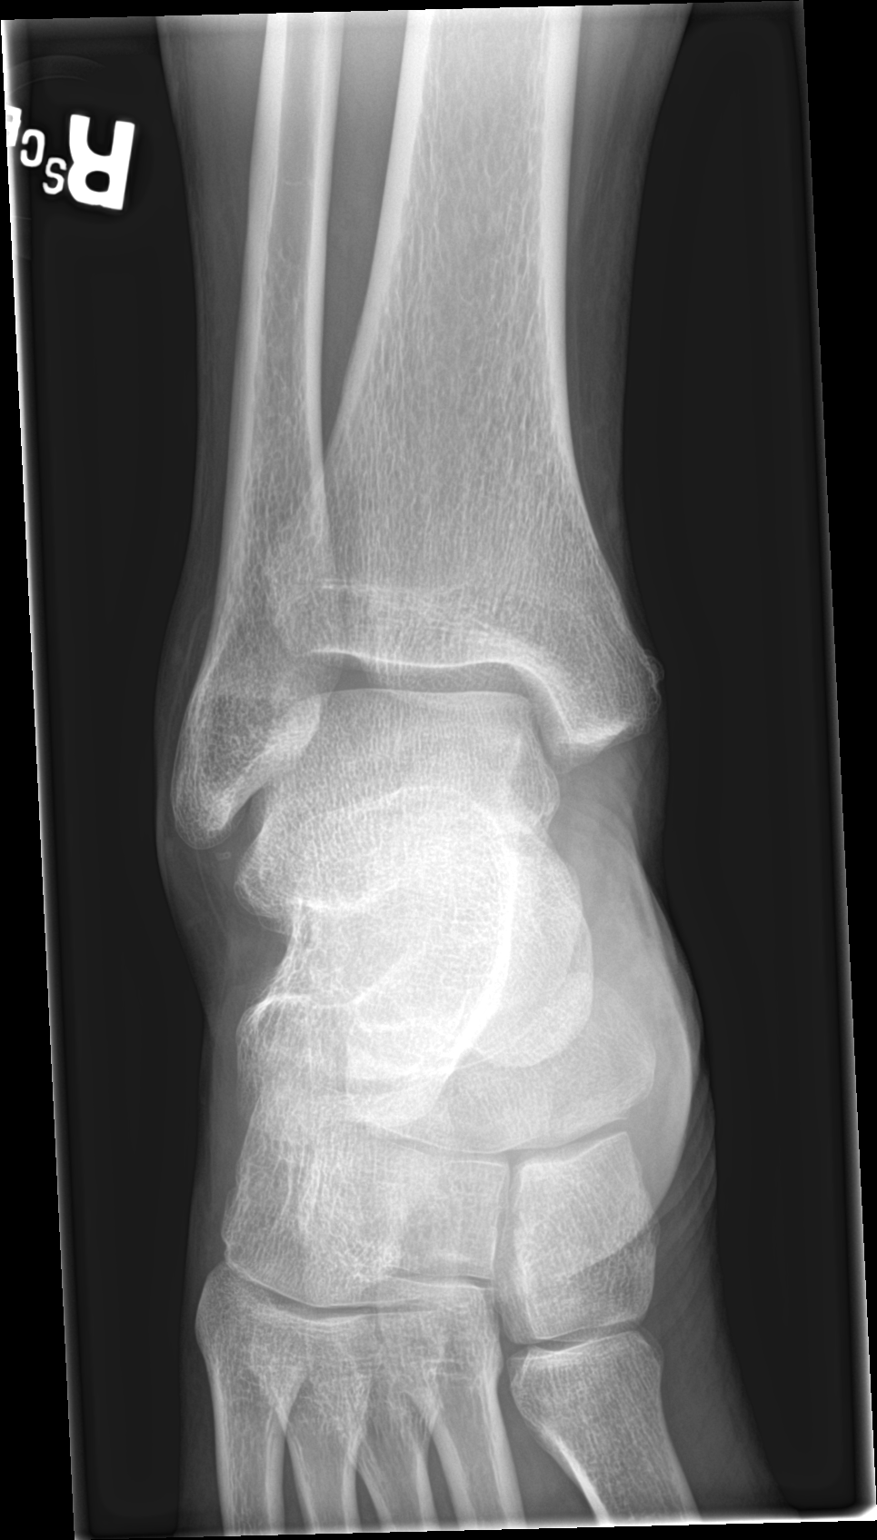
[im 2/3]
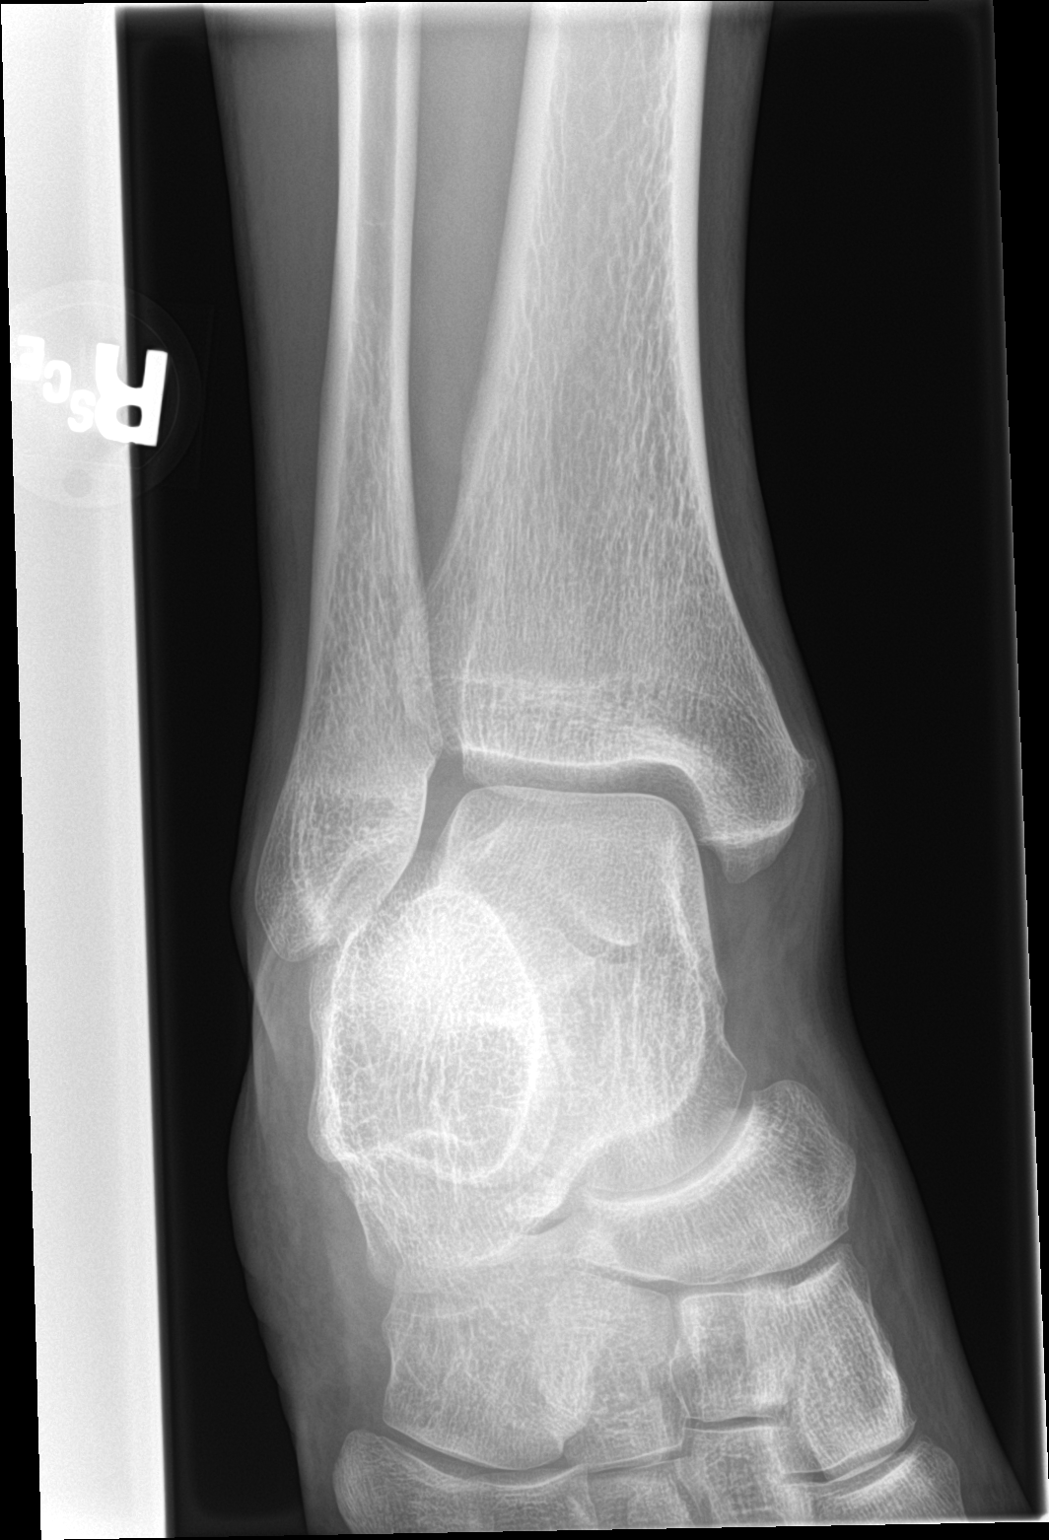
[im 3/3]
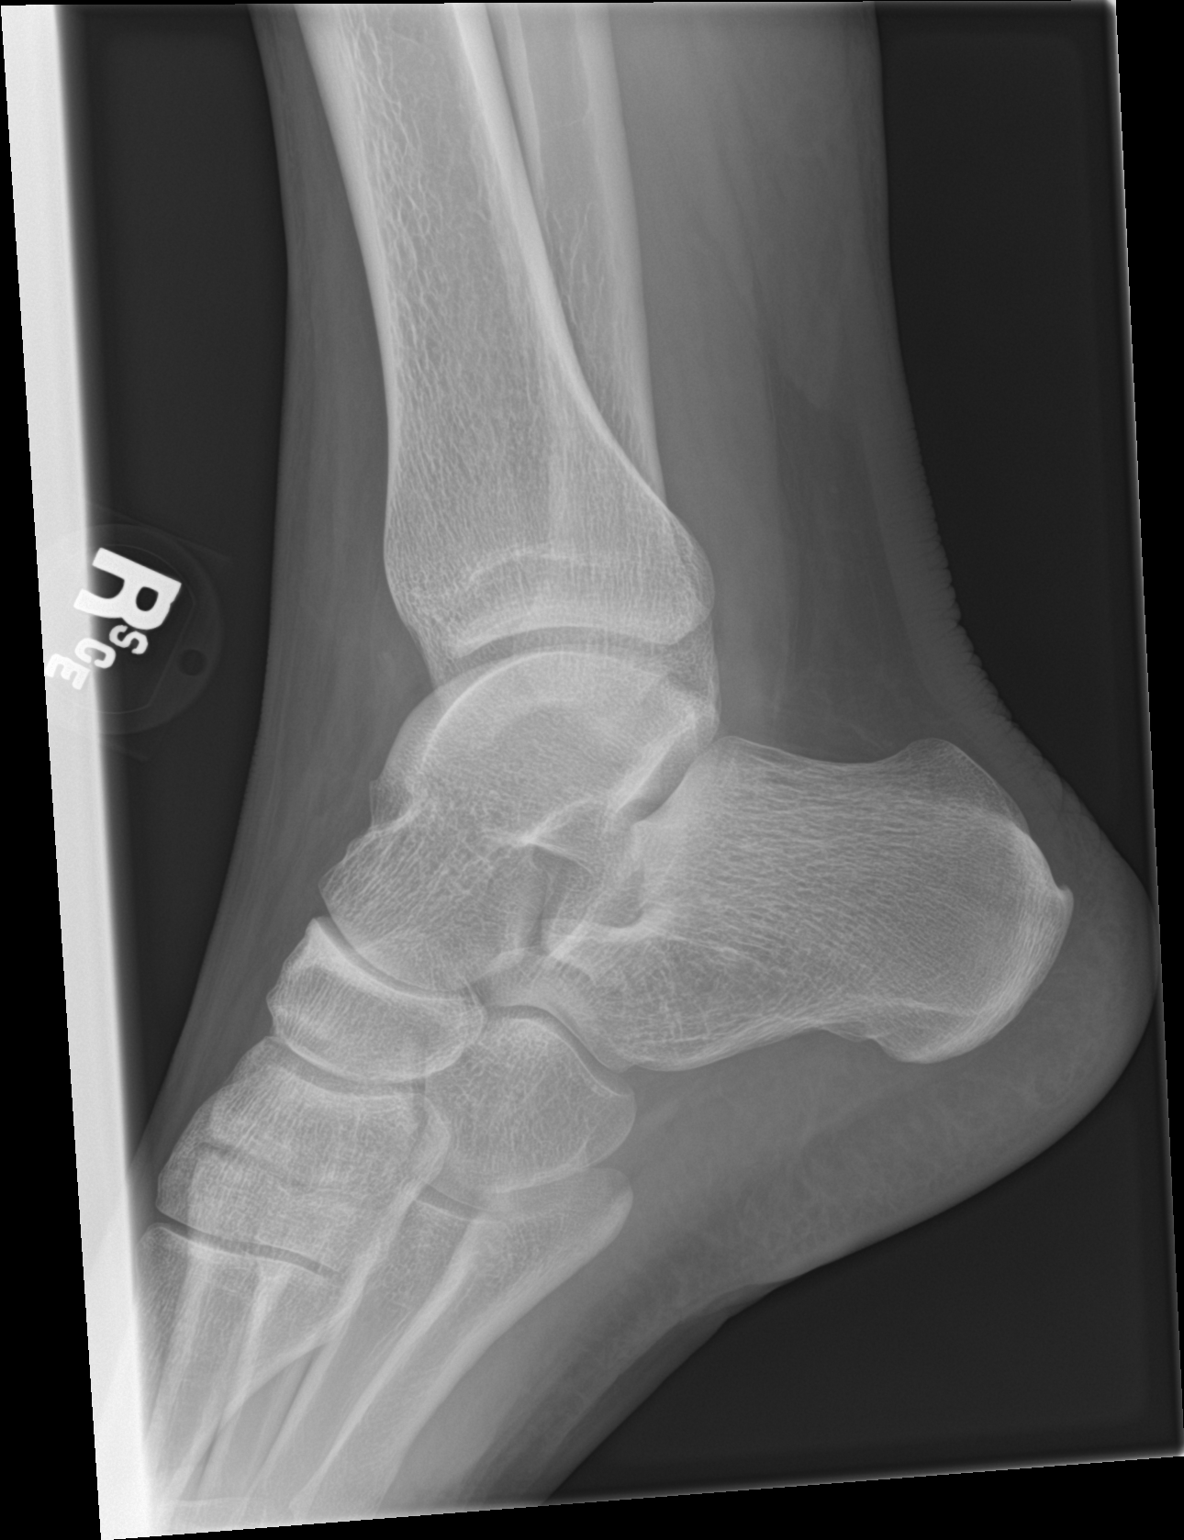

[3 of 3 positions shown; findings below may reference images not displayed]

FINDINGS: There is no evidence of acute fracture, dislocation, or joint
effusion. There is no evidence of arthropathy or other focal bone
abnormality. Soft tissues are unremarkable.
IMPRESSION: No acute findings.

## 2016-08-11 IMAGING — CR DG LUMBAR SPINE COMPLETE 4+V
1 series · 5 of 5 positions shown · non-contrast
Comparison: None.

CLINICAL DATA: Fell off ladder today. Low back injury and pain.
Initial encounter.

EXAM:
LUMBAR SPINE - COMPLETE 4+ VIEW

[Series 1: dg lumbar spine complete 4 +v · 0.14mm/px · 5 of 5 slices shown]
[im 1/5]
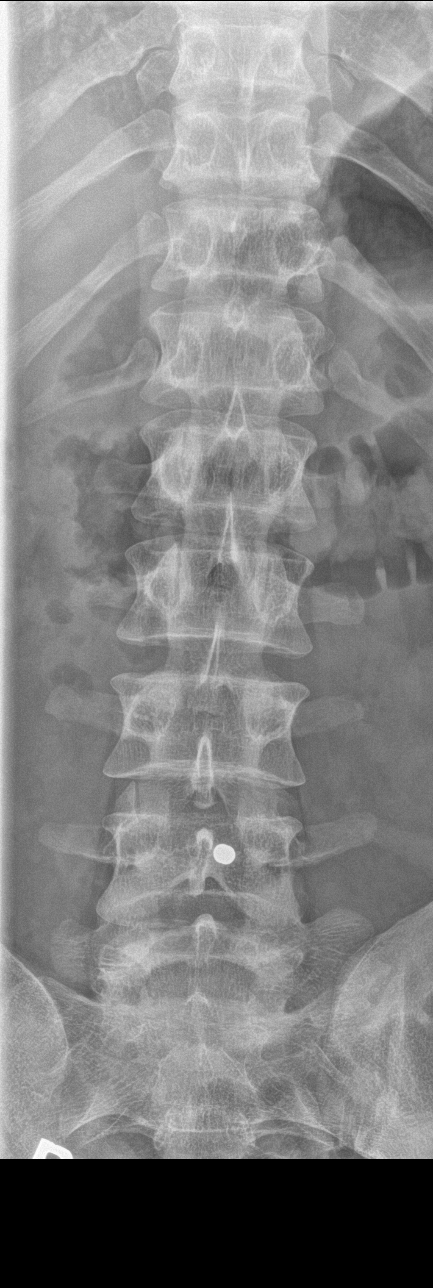
[im 2/5]
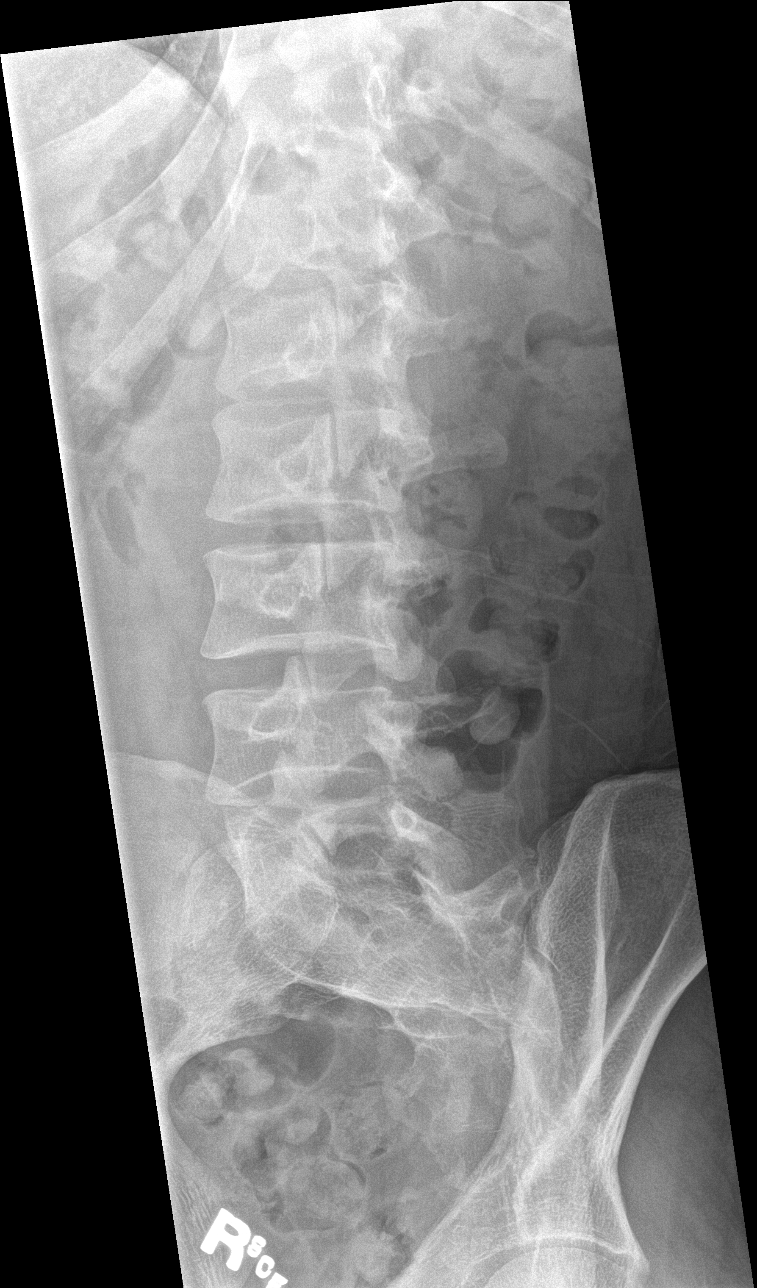
[im 3/5]
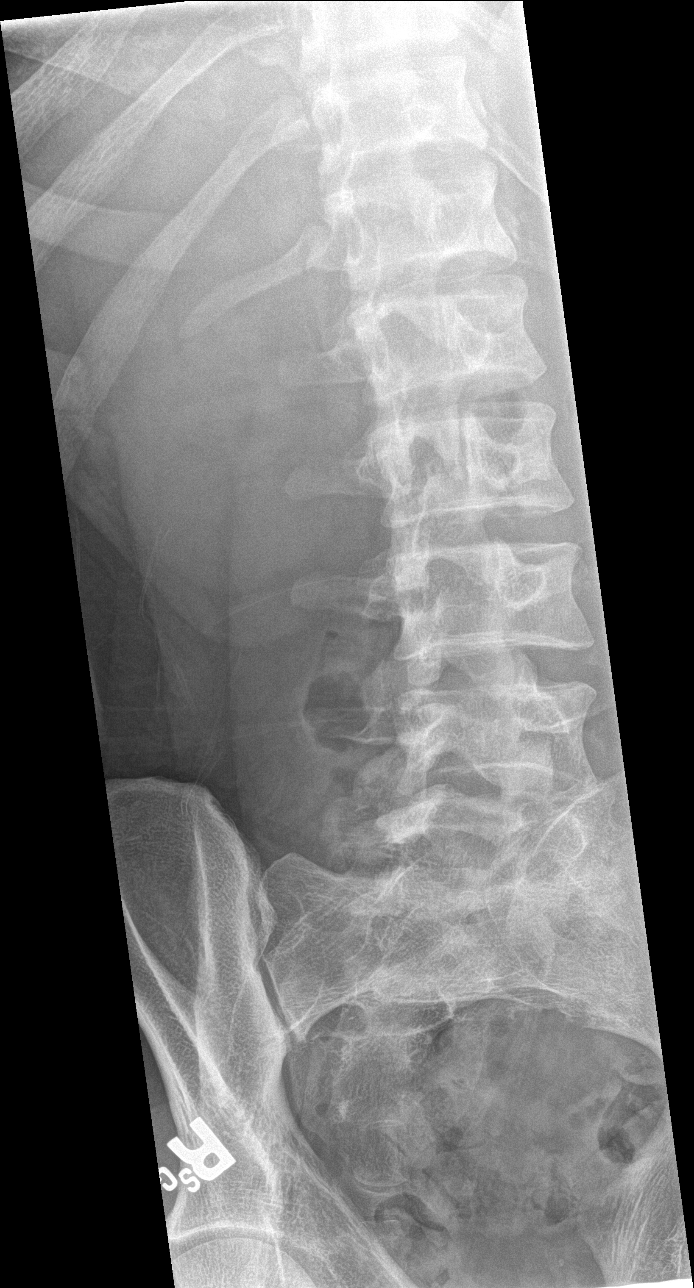
[im 4/5]
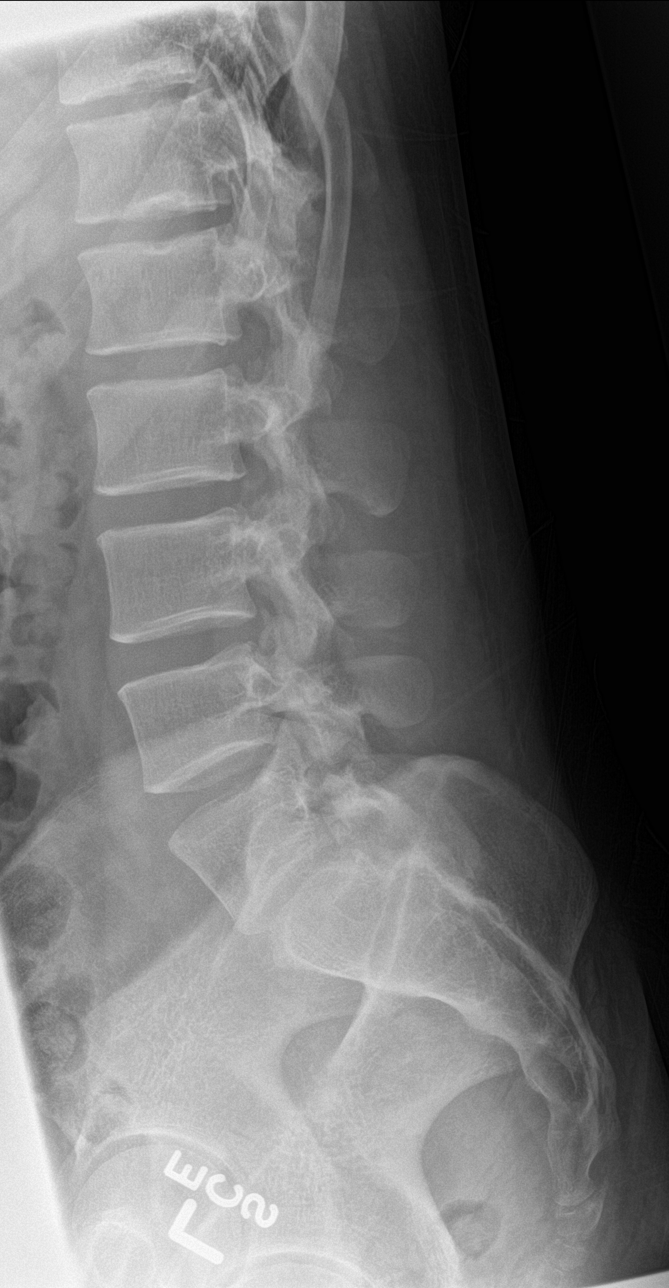
[im 5/5]
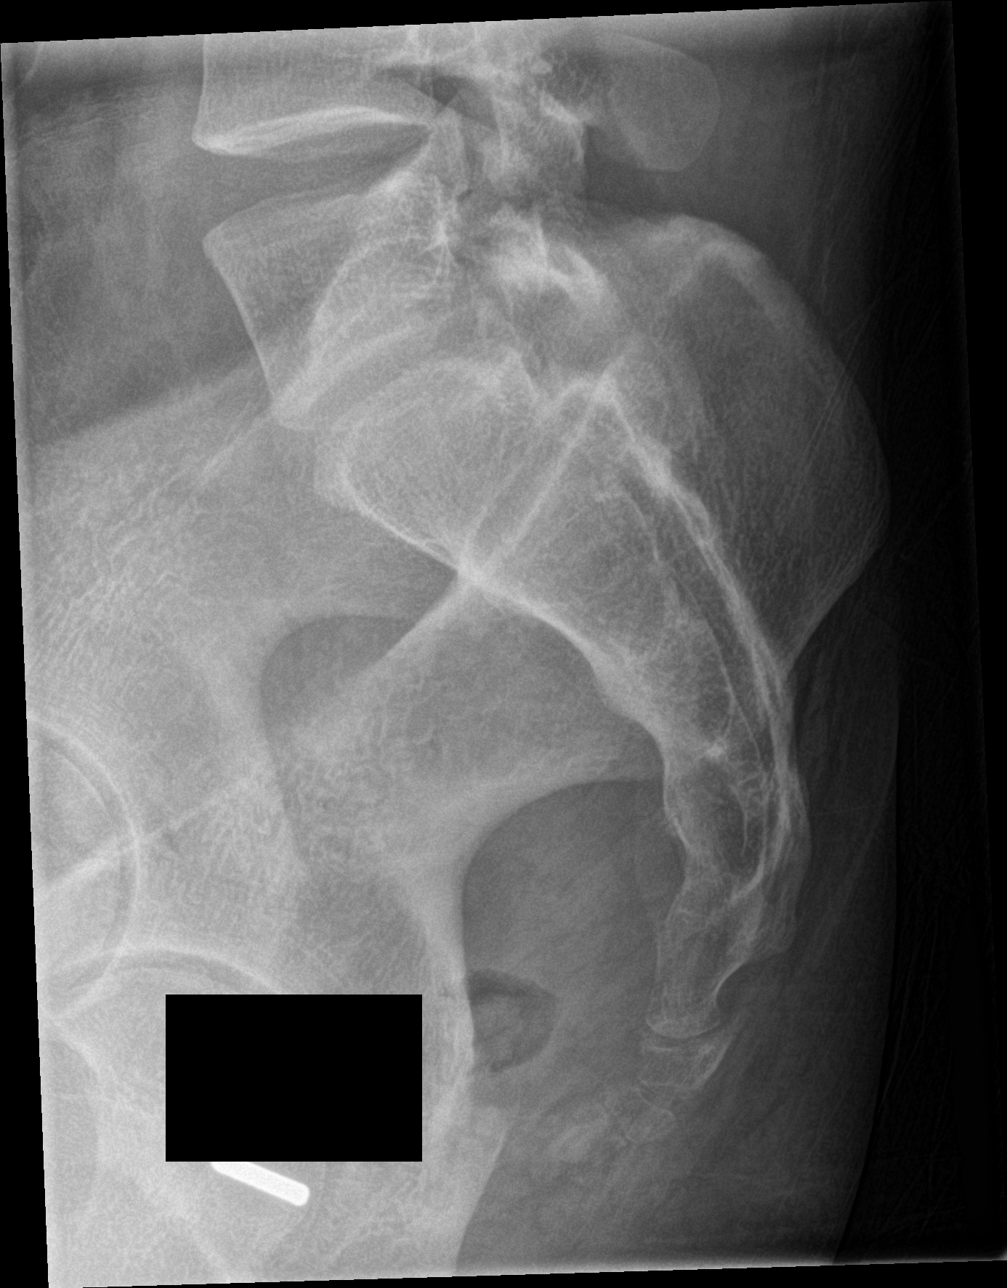

[5 of 5 positions shown; findings below may reference images not displayed]

FINDINGS: There is no evidence of lumbar spine fracture. Alignment is normal.
Intervertebral disc spaces are maintained. No evidence of facet
arthropathy or other bone lesions.
IMPRESSION: Negative lumbar spine radiographs.

## 2016-12-23 IMAGING — CR DG LUMBAR SPINE 2-3V
1 series · 3 of 3 positions shown · non-contrast
Comparison: 06/24/2015

CLINICAL DATA: Pt to ed with c/o right low back pain after falling
down steps last night at home. Pt reports the steps were slick and
that caused him to slip.

EXAM:
LUMBAR SPINE - 2-3 VIEW

[Series 1: dg lumbar spine 2-3 views · 0.14mm/px · 3 of 3 slices shown]
[im 1/3]
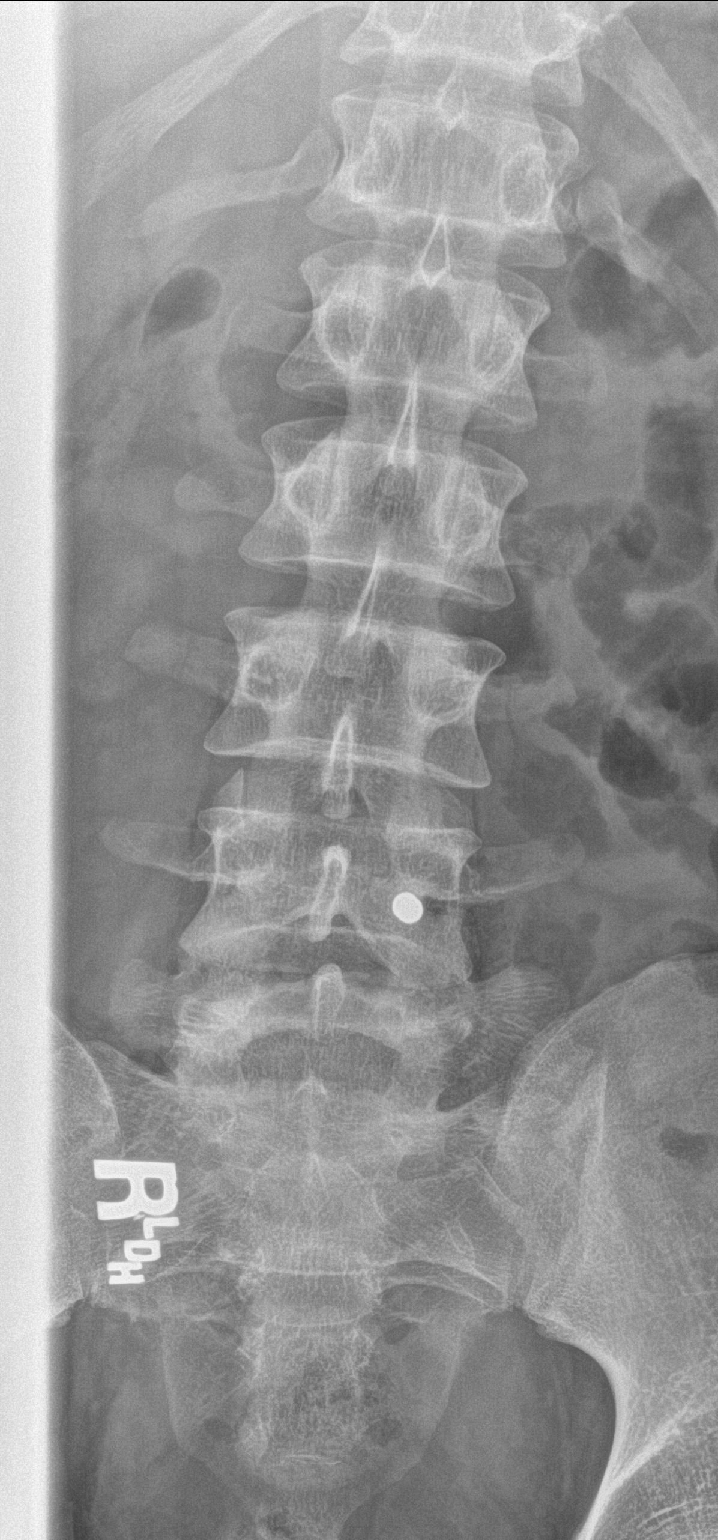
[im 2/3]
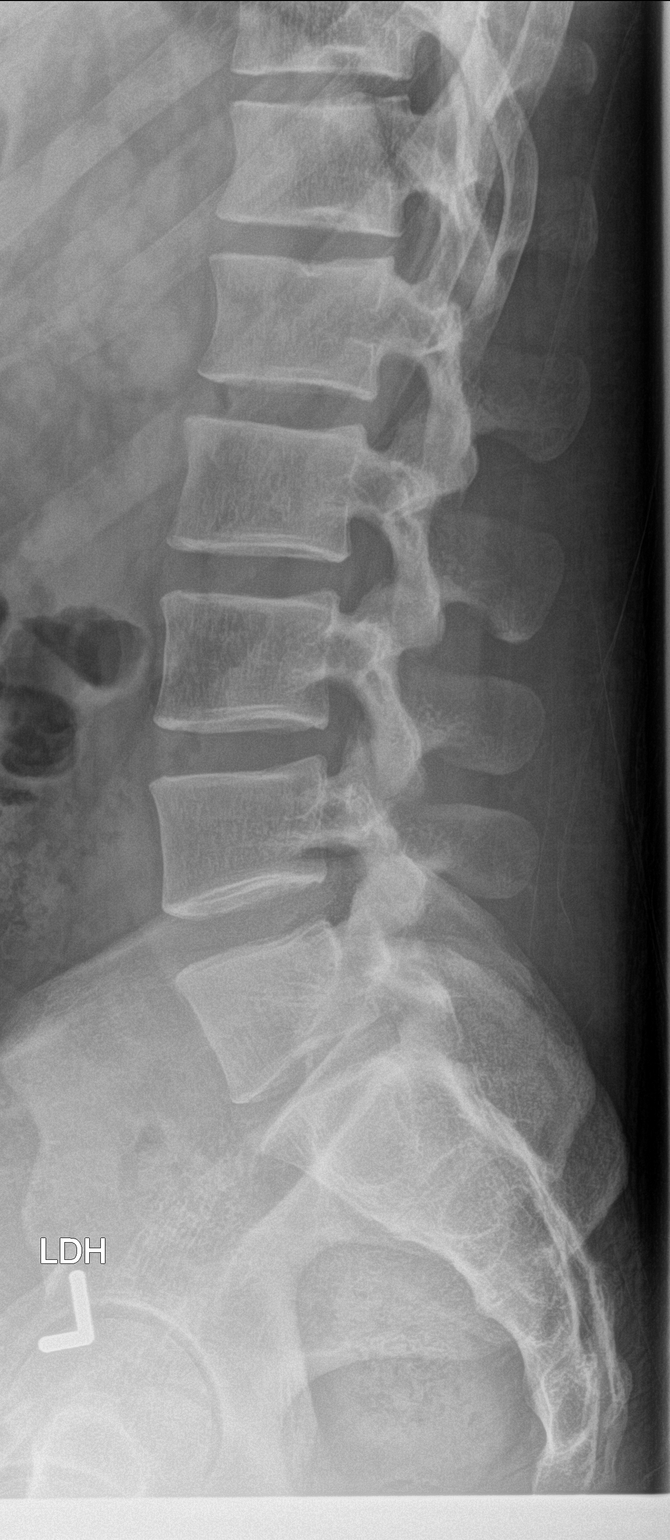
[im 3/3]
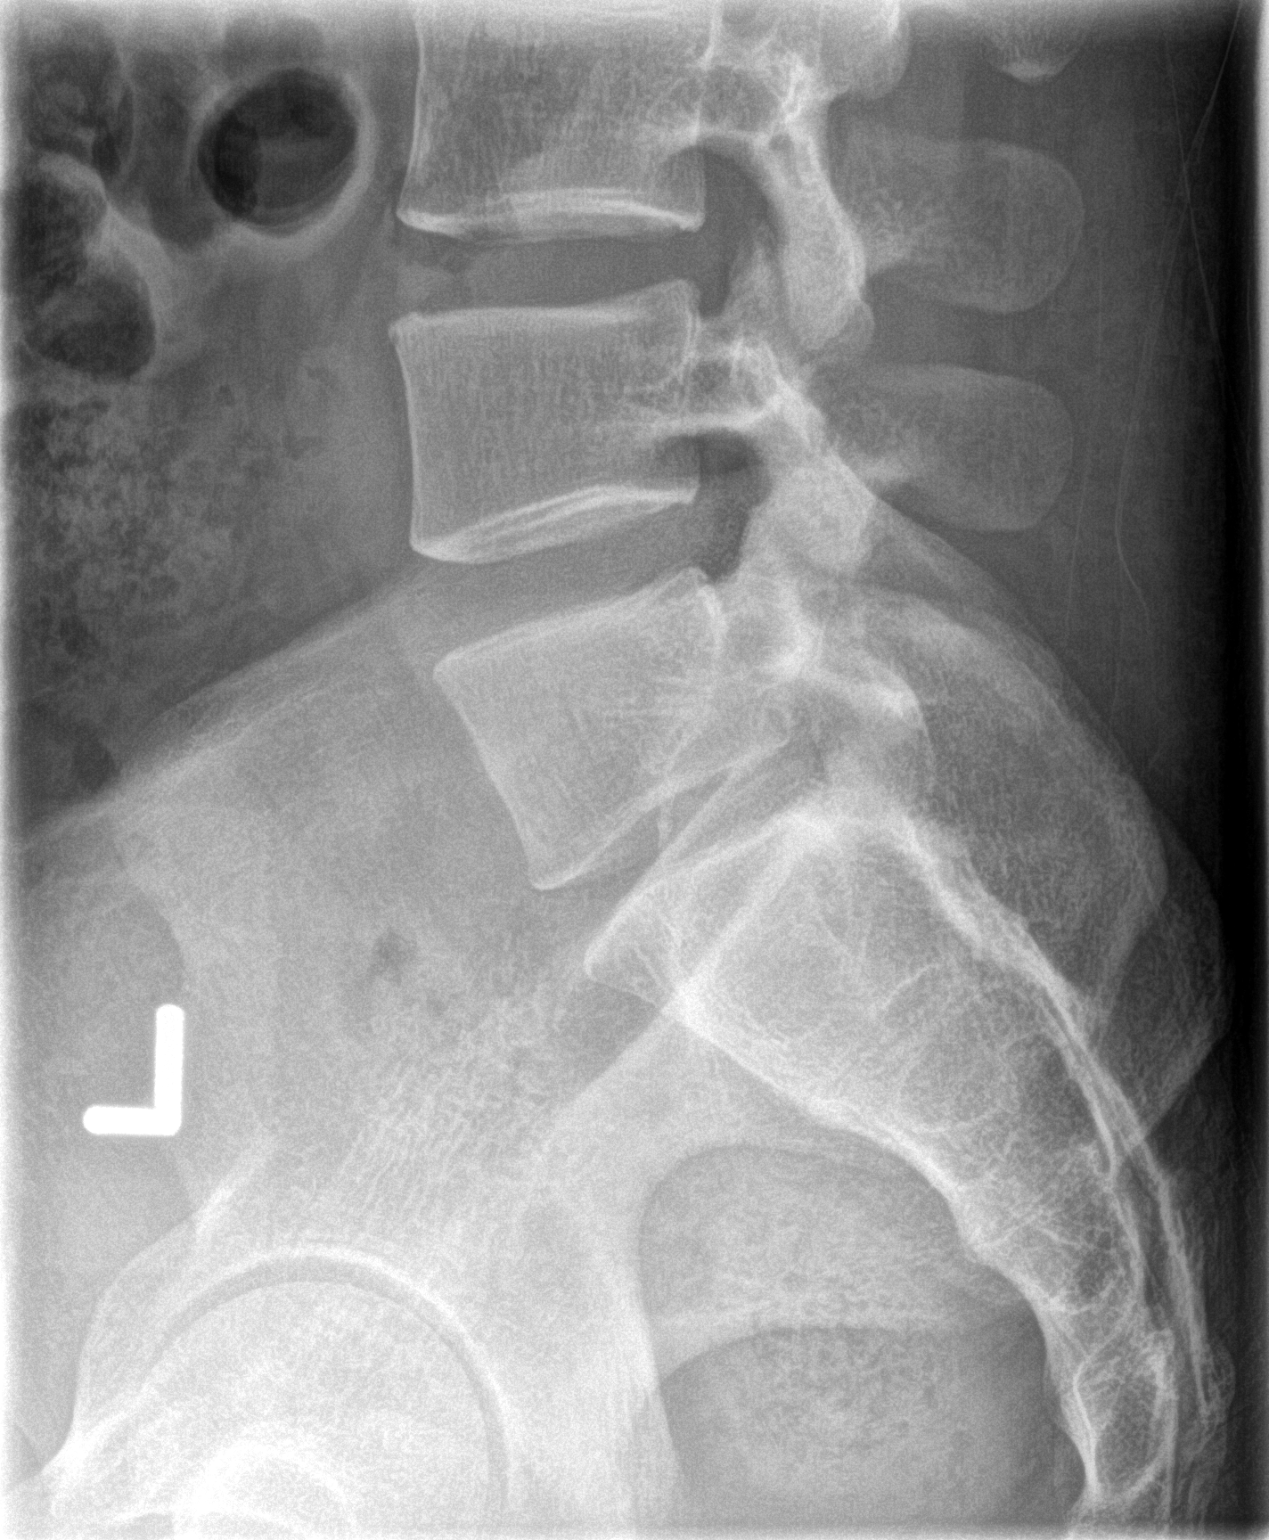

[3 of 3 positions shown; findings below may reference images not displayed]

FINDINGS: There is no evidence of lumbar spine fracture. Alignment is normal.
Intervertebral disc spaces are maintained.
IMPRESSION: Negative.

## 2017-09-13 ENCOUNTER — Other Ambulatory Visit: Payer: Self-pay

## 2017-09-13 ENCOUNTER — Emergency Department
Admission: EM | Admit: 2017-09-13 | Discharge: 2017-09-13 | Disposition: A | Discharge status: Home / Self Care | Payer: Self-pay | Attending: Emergency Medicine | Admitting: Emergency Medicine

## 2017-09-13 DIAGNOSIS — R509 Fever, unspecified: Secondary | ICD-10-CM | POA: Insufficient documentation

## 2017-09-13 DIAGNOSIS — J45909 Unspecified asthma, uncomplicated: Secondary | ICD-10-CM | POA: Insufficient documentation

## 2017-09-13 DIAGNOSIS — F17210 Nicotine dependence, cigarettes, uncomplicated: Secondary | ICD-10-CM | POA: Insufficient documentation

## 2017-09-13 DIAGNOSIS — Z79899 Other long term (current) drug therapy: Secondary | ICD-10-CM | POA: Insufficient documentation

## 2017-09-13 DIAGNOSIS — J020 Streptococcal pharyngitis: Secondary | ICD-10-CM | POA: Insufficient documentation

## 2017-09-13 DIAGNOSIS — M7918 Myalgia, other site: Secondary | ICD-10-CM | POA: Insufficient documentation

## 2017-09-13 LAB — GROUP A STREP BY PCR: Group A Strep by PCR: DETECTED — AB

## 2017-09-13 MED ORDER — DEXAMETHASONE SODIUM PHOSPHATE 10 MG/ML IJ SOLN
10.0000 mg | Freq: Once | INTRAMUSCULAR | Status: AC
Start: 2017-09-13 — End: 2017-09-13
  Administered 2017-09-13: 10 mg via INTRAVENOUS
  Filled 2017-09-13: qty 1

## 2017-09-13 MED ORDER — AMOXICILLIN-POT CLAVULANATE 875-125 MG PO TABS: 1.0000 | ORAL_TABLET | Freq: Two times a day (BID) | ORAL | 0 refills | Status: AC

## 2017-09-13 MED ORDER — ACETAMINOPHEN 500 MG PO TABS
1000.0000 mg | ORAL_TABLET | Freq: Once | ORAL | Status: AC
Start: 2017-09-13 — End: 2017-09-13
  Administered 2017-09-13: 1000 mg via ORAL
  Filled 2017-09-13: qty 2

## 2017-09-13 MED ORDER — IBUPROFEN 800 MG PO TABS
800.0000 mg | ORAL_TABLET | Freq: Once | ORAL | Status: AC
  Administered 2017-09-13: 800 mg via ORAL
  Filled 2017-09-13: qty 1

## 2017-09-13 MED ORDER — PENICILLIN G BENZATHINE 1200000 UNIT/2ML IM SUSP
1.2000 10*6.[IU] | Freq: Once | INTRAMUSCULAR | Status: AC
  Administered 2017-09-13: 1.2 10*6.[IU] via INTRAMUSCULAR
  Filled 2017-09-13: qty 2

## 2017-09-13 MED ORDER — SODIUM CHLORIDE 0.9 % IV BOLUS
1000.0000 mL | Freq: Once | INTRAVENOUS | Status: AC
  Administered 2017-09-13: 1000 mL via INTRAVENOUS

## 2017-09-13 NOTE — ED Triage Notes (Signed)
Pt presents today with body aches and sore throat. Pt states this is the 4th day. Pt denies n/v/d

## 2017-09-13 NOTE — Discharge Instructions (Addendum)
Please complete antibiotics and make sure you are drinking lots of fluids.  Alternate Tylenol and ibuprofen as needed for any body aches or fevers.  If any fevers above 102 that are not going down Tylenol and ibuprofen, difficulty swallowing, changes in your voice, worsening symptoms or urgent changes in your health please return to the emergency department.

## 2017-09-13 NOTE — ED Triage Notes (Signed)
First RN: Pt presents to ED via POV with c/o sore throat, fever and generalized body aches. Pt's family member reports that their daughter just recently dx with strep throat.

## 2017-09-13 NOTE — ED Provider Notes (Signed)
[facility] EMERGENCY DEPARTMENT Provider Note   CSN: [id number] Arrival date & time: 09/13/17  4098     History   Chief Complaint Chief Complaint  Patient presents with  . Sore Throat    HPI Nathan Pena is a 27 y.o. male presents the emerge department for evaluation of body aches, sore throat.  Symptoms been present for 4 days.  Denies any headaches, nausea vomiting or diarrhea.  Sore throat pain is severe.  He is able to tolerate some fluids but has severe pain with swallowing.  He has not take any medications for his fevers.  Fevers have been subjective.  He denies any known contacts with strep.  Denies any abdominal pain, neck pain, rashes.  HPI  Past Medical History:  Diagnosis Date  . Asthma     There are no active problems to display for this patient.   No past surgical history on file.      Home Medications    Prior to Admission medications   Medication Sig Start Date End Date Taking? Authorizing Provider  amoxicillin-clavulanate (AUGMENTIN) 875-125 MG tablet Take 1 tablet by mouth every 12 (twelve) hours for 10 days. 09/13/17 09/23/17  Evon Slack, PA-C  cyclobenzaprine (FLEXERIL) 5 MG tablet Take 1 tablet (5 mg total) by mouth every 8 (eight) hours as needed for muscle spasms. 06/24/15   Evon Slack, PA-C  diazepam (VALIUM) 2 MG tablet Take 1 tablet (2 mg total) by mouth every 8 (eight) hours as needed for muscle spasms. 11/05/15   Tommi Rumps, PA-C  dicyclomine (BENTYL) 20 MG tablet Take 1 tablet (20 mg total) by mouth 3 (three) times daily as needed for spasms. 03/26/15   Jennye Moccasin, MD  ibuprofen (ADVIL,MOTRIN) 800 MG tablet Take 1 tablet (800 mg total) by mouth every 8 (eight) hours as needed for moderate pain. 01/18/15   Joni Reining, PA-C  ibuprofen (ADVIL,MOTRIN) 800 MG tablet Take 1 tablet (800 mg total) by mouth every 8 (eight) hours as needed. 06/24/15   Evon Slack, PA-C  ondansetron (ZOFRAN) 4 MG tablet  Take 1 tablet (4 mg total) by mouth every 8 (eight) hours as needed for nausea or vomiting. 03/26/15   Jennye Moccasin, MD  oxyCODONE-acetaminophen (PERCOCET) 5-325 MG tablet Take 1 tablet by mouth every 4 (four) hours as needed for severe pain. 11/05/15   Tommi Rumps, PA-C  pantoprazole (PROTONIX) 20 MG tablet Take 1 tablet (20 mg total) by mouth daily. 03/26/15 03/25/16  Jennye Moccasin, MD    Family History No family history on file.  Social History Social History   Tobacco Use  . Smoking status: Current Every Day Smoker    Packs/day: 0.50  . Smokeless tobacco: Never Used  Substance Use Topics  . Alcohol use: Yes    Comment: occasional  . Drug use: No     Allergies   Patient has no known allergies.   Review of Systems Review of Systems  Constitutional: Positive for fever.  HENT: Positive for sore throat. Negative for congestion, rhinorrhea, sinus pressure and sinus pain.   Respiratory: Negative for cough, wheezing and stridor.   Gastrointestinal: Negative for abdominal pain, diarrhea, nausea and vomiting.  Musculoskeletal: Positive for myalgias. Negative for arthralgias and neck stiffness.  Skin: Negative for rash.  Neurological: Negative for dizziness.     Physical Exam Updated Vital Signs BP 114/66 (BP Location: Right Arm)   Pulse 97   Temp 98.2 F (  36.8 C) (Oral)   Resp 16   Ht 5\' 10"  (1.778 m)   Wt 88 kg   SpO2 98%   BMI 27.84 kg/m   Physical Exam  Constitutional: He is oriented to person, place, and time. He appears well-developed and well-nourished. No distress.  HENT:  Head: Normocephalic and atraumatic.  Right Ear: Hearing, tympanic membrane, external ear and ear canal normal.  Left Ear: Hearing, tympanic membrane, external ear and ear canal normal.  Nose: Rhinorrhea present. No nasal septal hematoma. Right sinus exhibits no maxillary sinus tenderness and no frontal sinus tenderness. Left sinus exhibits no maxillary sinus tenderness and no frontal  sinus tenderness.  Mouth/Throat: Uvula is midline and oropharynx is clear and moist. No trismus in the jaw. No uvula swelling. No oropharyngeal exudate, posterior oropharyngeal edema, posterior oropharyngeal erythema or tonsillar abscesses. Tonsillar exudate.  Eyes: Conjunctivae are normal.  Neck: Normal range of motion.  Cardiovascular: Normal rate and regular rhythm.  Pulmonary/Chest: No stridor. No respiratory distress. He has no wheezes. He exhibits no tenderness.  Abdominal: Soft. He exhibits no distension. There is no tenderness. There is no guarding.  Musculoskeletal: Normal range of motion.  Lymphadenopathy:    He has cervical adenopathy.  Neurological: He is alert and oriented to person, place, and time.  Skin: Skin is warm and dry. No rash noted.  Psychiatric: He has a normal mood and affect. His behavior is normal. Judgment and thought content normal.  Nursing note and vitals reviewed.    ED Treatments / Results  Labs (all labs ordered are listed, but only abnormal results are displayed) Labs Reviewed  GROUP A STREP BY PCR - Abnormal; Notable for the following components:      Result Value   Group A Strep by PCR DETECTED (*)    All other components within normal limits  GROUP A STREP BY PCR    EKG None  Radiology No results found.  Procedures Procedures (including critical care time)  Medications Ordered in ED Medications  sodium chloride 0.9 % bolus 1,000 mL (0 mLs Intravenous Stopped 09/13/17 1156)  penicillin g benzathine (BICILLIN LA) 1200000 UNIT/2ML injection 1.2 Million Units (1.2 Million Units Intramuscular Given 09/13/17 1057)  dexamethasone (DECADRON) injection 10 mg (10 mg Intravenous Given 09/13/17 1057)  acetaminophen (TYLENOL) tablet 1,000 mg (1,000 mg Oral Given 09/13/17 1059)  ibuprofen (ADVIL,MOTRIN) tablet 800 mg (800 mg Oral Given 09/13/17 1219)     Initial Impression / Assessment and Plan / ED Course  I have reviewed the triage vital signs and  the nursing notes.  Pertinent labs & imaging results that were available during my care of the patient were reviewed by me and considered in my medical decision making (see chart for details).     27 year old male with strep pharyngitis and fever.  Patient tachycardic and febrile upon arrival.  After Tylenol, dexamethasone IM, 1 L of fluids patient able to tolerate p.o. well.  Fever and tachycardia resolved.  Patient will take Augmentin twice daily for 10 days as prescribed, increase his fluids and continue to monitor himself for any fevers.  If any difficulty swallowing, fevers above 102, worsening symptoms or any changes healthy return to the ED.  Final Clinical Impressions(s) / ED Diagnoses   Final diagnoses:  Strep pharyngitis  Fever, unspecified fever cause    ED Discharge Orders         Ordered    amoxicillin-clavulanate (AUGMENTIN) 875-125 MG tablet  Every 12 hours     09/13/17 1236  Evon Slack, PA-C 09/13/17 1241    Schaevitz, Myra Rude, MD 09/13/17 [phone]

## 2018-03-04 ENCOUNTER — Emergency Department
Admission: EM | Admit: 2018-03-04 | Discharge: 2018-03-04 | Disposition: A | Discharge status: Home / Self Care | Payer: Self-pay | Attending: Emergency Medicine | Admitting: Emergency Medicine

## 2018-03-04 ENCOUNTER — Encounter: Payer: Self-pay | Admitting: Emergency Medicine

## 2018-03-04 DIAGNOSIS — J45909 Unspecified asthma, uncomplicated: Secondary | ICD-10-CM | POA: Insufficient documentation

## 2018-03-04 DIAGNOSIS — L02412 Cutaneous abscess of left axilla: Secondary | ICD-10-CM | POA: Insufficient documentation

## 2018-03-04 DIAGNOSIS — F17210 Nicotine dependence, cigarettes, uncomplicated: Secondary | ICD-10-CM | POA: Insufficient documentation

## 2018-03-04 MED ORDER — HYDROCODONE-ACETAMINOPHEN 5-325 MG PO TABS: 1.0000 | ORAL_TABLET | Freq: Four times a day (QID) | ORAL | 0 refills | Status: DC | PRN

## 2018-03-04 MED ORDER — SULFAMETHOXAZOLE-TRIMETHOPRIM 800-160 MG PO TABS: 1.0000 | ORAL_TABLET | Freq: Two times a day (BID) | ORAL | 0 refills | Status: DC

## 2018-03-04 NOTE — ED Provider Notes (Signed)
[facility] Emergency Department Provider Note  ____________________________________________   First MD Initiated Contact with Patient 03/04/18 [phone]     (approximate)  I have reviewed the triage vital signs and the nursing notes.   HISTORY  Chief Complaint Abscess   HPI Nathan Pena is a 28 y.o. male presents to the ED with complaint of lump in the left axilla since yesterday.  He denies any previous abscesses.  He denies any fever or chills.  He rates his pain as a 7 out of 10.    Past Medical History:  Diagnosis Date  . Asthma     There are no active problems to display for this patient.   History reviewed. No pertinent surgical history.  Prior to Admission medications   Medication Sig Start Date End Date Taking? Authorizing Provider  HYDROcodone-acetaminophen (NORCO/VICODIN) 5-325 MG tablet Take 1 tablet by mouth every 6 (six) hours as needed for moderate pain. 03/04/18   Tommi Rumps, PA-C  pantoprazole (PROTONIX) 20 MG tablet Take 1 tablet (20 mg total) by mouth daily. 03/26/15 03/25/16  Jennye Moccasin, MD  sulfamethoxazole-trimethoprim (BACTRIM DS,SEPTRA DS) 800-160 MG tablet Take 1 tablet by mouth 2 (two) times daily. 03/04/18   Tommi Rumps, PA-C    Allergies Patient has no known allergies.  No family history on file.  Social History Social History   Tobacco Use  . Smoking status: Current Every Day Smoker    Packs/day: 0.50  . Smokeless tobacco: Never Used  Substance Use Topics  . Alcohol use: Yes    Comment: occasional  . Drug use: No    Review of Systems Constitutional: No fever/chills Cardiovascular: Denies chest pain. Respiratory: Denies shortness of breath. Gastrointestinal:   No nausea, no vomiting.  Musculoskeletal: Negative for muscle skeletal pain. Skin: Positive for left axilla abscess. Neurological: Negative for focal weakness or numbness. ___________________________________________   PHYSICAL  EXAM:  VITAL SIGNS: ED Triage Vitals  Enc Vitals Group     BP 03/04/18 0907 136/87     Pulse Rate 03/04/18 0907 79     Resp 03/04/18 0907 15     Temp 03/04/18 0907 98.4 F (36.9 C)     Temp Source 03/04/18 0907 Oral     SpO2 03/04/18 0907 99 %     Weight 03/04/18 0905 207 lb (93.9 kg)     Height 03/04/18 0905 5\' 10"  (1.778 m)     Head Circumference --      Peak Flow --      Pain Score 03/04/18 0904 7     Pain Loc --      Pain Edu? --      Excl. in [place]? --    Constitutional: Alert and oriented. Well appearing and in no acute distress. Eyes: Conjunctivae are normal.  Head: Atraumatic. Neck: No stridor.   Cardiovascular: Normal rate, regular rhythm. Grossly normal heart sounds.  Good peripheral circulation. Respiratory: Normal respiratory effort.  No retractions. Lungs CTAB. Musculoskeletal: Moves upper and lower extremities without any difficulty.  Normal gait was noted. Neurologic:  Normal speech and language. No gross focal neurologic deficits are appreciated.  Skin:  Skin is warm, dry and intact.  Left axilla without erythema but there is a palpable cystic lesion that is moderately tender in the axilla area that most likely is an early abscess.  Cyst is mobile, round with discrete margins. Psychiatric: Mood and affect are normal. Speech and behavior are normal.  ____________________________________________   LABS (  all labs ordered are listed, but only abnormal results are displayed)  Labs Reviewed - No data to display ____________________________________________  PROCEDURES  Procedure(s) performed: None  Procedures  Critical Care performed: No  ____________________________________________   INITIAL IMPRESSION / ASSESSMENT AND PLAN / ED COURSE  As part of my medical decision making, I reviewed the following data within the electronic MEDICAL RECORD NUMBER Notes from prior ED visits and [place] Controlled Substance Database  Patient presents to the ED with complaint of lump  in the left axilla that began yesterday.  Patient denies any previous abscesses.  He states that pain is increased with movement of his left arm.  On exam area is moderately tender without erythema or warmth but is suspicious for an early abscess.  Patient was started on Bactrim DS twice daily for 10 days along with Norco 1 every 6 hours as needed for pain.  He is encouraged to use warm moist compresses to the area frequently and return to the emergency department if any severe worsening of his symptoms.  ____________________________________________   FINAL CLINICAL IMPRESSION(S) / ED DIAGNOSES  Final diagnoses:  Abscess of axilla, left     ED Discharge Orders         Ordered    sulfamethoxazole-trimethoprim (BACTRIM DS,SEPTRA DS) 800-160 MG tablet  2 times daily     03/04/18 0958    HYDROcodone-acetaminophen (NORCO/VICODIN) 5-325 MG tablet  Every 6 hours PRN     03/04/18 0958           Note:  This document was prepared using Dragon voice recognition software and may include unintentional dictation errors.    Tommi RumpsSummers, Braiden Presutti L, PA-C 03/04/18 1008    Nita SickleVeronese, [place], MD 03/07/18 [phone]

## 2018-03-04 NOTE — ED Notes (Addendum)
Refer to triage note: pt states he noted "a lump under his left arm yesterday"; pain 7/10 gets worse with movement. Upon assessment this RN palpated a small lump under pt's L/arm w/some redness w/out  drainage .

## 2018-03-04 NOTE — ED Triage Notes (Signed)
Pt reports abscess under left arm for the past 2 days.

## 2018-03-04 NOTE — Discharge Instructions (Signed)
Follow-up with your primary care provider if any continued problems, [facility] or return to the emergency department if any urgent concerns or worsening of your abscess. Begin using warm moist compresses to the area frequently.  Bactrim DS twice a day for the next 10 days is for infection.  Norco is 1 every 6 hours as needed for pain.  Do not drive or operate machinery while taking this medication as it contains a narcotic and could cause drowsiness.

## 2018-09-13 ENCOUNTER — Emergency Department
Admission: EM | Admit: 2018-09-13 | Discharge: 2018-09-13 | Disposition: A | Discharge status: Home / Self Care | Payer: Self-pay | Attending: Emergency Medicine | Admitting: Emergency Medicine

## 2018-09-13 ENCOUNTER — Other Ambulatory Visit: Payer: Self-pay

## 2018-09-13 ENCOUNTER — Encounter: Payer: Self-pay | Admitting: Emergency Medicine

## 2018-09-13 DIAGNOSIS — K0889 Other specified disorders of teeth and supporting structures: Secondary | ICD-10-CM | POA: Insufficient documentation

## 2018-09-13 DIAGNOSIS — R6884 Jaw pain: Secondary | ICD-10-CM | POA: Insufficient documentation

## 2018-09-13 DIAGNOSIS — J45909 Unspecified asthma, uncomplicated: Secondary | ICD-10-CM | POA: Insufficient documentation

## 2018-09-13 DIAGNOSIS — Z79899 Other long term (current) drug therapy: Secondary | ICD-10-CM | POA: Insufficient documentation

## 2018-09-13 DIAGNOSIS — F17210 Nicotine dependence, cigarettes, uncomplicated: Secondary | ICD-10-CM | POA: Insufficient documentation

## 2018-09-13 MED ORDER — AMOXICILLIN 500 MG PO CAPS: 500.0000 mg | ORAL_CAPSULE | Freq: Three times a day (TID) | ORAL | 0 refills | Status: DC

## 2018-09-13 NOTE — ED Triage Notes (Signed)
Pt in via POV, reprots right lower dental pain and jaw pain x 2 days, reports difficulty opening mouth completely.  NAD noted at this time.

## 2018-09-13 NOTE — Discharge Instructions (Signed)
Take the antibiotic as directed. Follow-up with one of the local dental clinics for routine care.  OPTIONS FOR DENTAL FOLLOW UP CARE  [place] Department of Health and Human Services - Local Safety Net Dental Clinics TripDoors.comhttp://www.ncdhhs.gov/dph/oralhealth/services/safetynetclinics.htm   [facility] [phone])  [facility] [phone])  [facility] [phone]  [facility] [phone])  [facility] [phone]) This clinic caters to the indigent population and is on a lottery system. Location: [employer] [address] [address], [address], [place] [facility] Hours: Wednesdays from 6pm - 9pm, patients seen by a lottery system. For dates, call or go to [email or web address] Services: Cleanings, fillings and simple extractions. Payment Options: DENTAL WORK IS FREE OF CHARGE. Bring proof of income or support. Best way to get seen: Arrive at 5:15 pm - this is a lottery, NOT first come/first serve, so arriving earlier will not increase your chances of being seen.     [facility] [phone] Select option 1 for emergencies   Location: [facility], [address], [address], [place] [facility] Hours: No walk-ins accepted - call the day before to schedule an appointment. Check in times are 9:30 am and 1:30 pm. Services: Simple extractions, temporary fillings, pulpectomy/pulp debridement, uncomplicated abscess drainage. Payment Options: PAYMENT IS DUE AT THE TIME OF SERVICE.  Fee is usually $100-200, additional surgical procedures (e.g. abscess drainage) may be extra. Cash, checks, Visa/MasterCard accepted.  Can file Medicaid if patient is covered for dental - patient should call case worker to check. No discount for [facility] patients. Best way to get seen: MUST call the day before and get onto the schedule. Can usually be seen  the next 1-2 days. No walk-ins accepted.     [facility] [phone]   Location: [facility], [address], [place] [facility] Hours: M, W, Th, F 8am or 1:30pm, Tues 9a or 1:30 - first come/first served. Services: Simple extractions, temporary fillings, uncomplicated abscess drainage.  You do not need to be an [facility] [place] resident. Payment Options: PAYMENT IS DUE AT THE TIME OF SERVICE. Dental insurance, otherwise sliding scale - bring proof of income or support. Depending on income and treatment needed, cost is usually $50-200. Best way to get seen: Arrive early as it is first come/first served.     [facility] [phone]   Location: [address] Clinic Hours: Mon-Thu 8a-5p Services: Most basic dental services including extractions and fillings. Payment Options: PAYMENT IS DUE AT THE TIME OF SERVICE. Sliding scale, up to 50% off - bring proof if income or support. Medicaid with dental option accepted. Best way to get seen: Call to schedule an appointment, can usually be seen within 2 weeks OR they will try to see walk-ins - show up at 8a or 2p (you may have to wait).     [facility] [phone] [place] RESIDENTS ONLY   Location: [facility], [address], [place], [place] [id number] Clinic Hours: By appointment only. Monday - Thursday 8am-5pm, Friday 8am-12pm Services: Cleanings, fillings, extractions. Payment Options: PAYMENT IS DUE AT THE TIME OF SERVICE. Cash, Visa or MasterCard. Sliding scale - $30 minimum per service. Best way to get seen: Come in to office, complete packet and make an appointment - need proof of income or support monies for each household member and proof of [place] residence. Usually takes about a month to get  in.     [facility] [phone]   Location: [place]  [place] [facility] Hours: Walk-in Urgent Care Dental Services are offered Monday-Friday mornings only. The numbers of emergencies accepted daily is limited to the number of providers available. Maximum 15 - Mondays, Wednesdays & Thursdays Maximum 10 - Tuesdays & Fridays Services: You do not need to be a [facility] resident to be seen for a dental emergency. Emergencies are defined as pain, swelling, abnormal bleeding, or dental trauma. Walkins will receive x-rays if needed. NOTE: Dental cleaning is not an emergency. Payment Options: PAYMENT IS DUE AT THE TIME OF SERVICE. Minimum co-pay is $40.00 for uninsured patients. Minimum co-pay is $3.00 for Medicaid with dental coverage. Dental Insurance is accepted and must be presented at time of visit. Medicare does not cover dental. Forms of payment: Cash, credit card, checks. Best way to get seen: If not previously registered with the clinic, walk-in dental registration begins at 7:15 am and is on a first come/first serve basis. If previously registered with the clinic, call to make an appointment.     [facility] [place] ONLY   Location: [address] [place], [place] Clinic Hours: Mon-Thu 10a-2p Services: Extractions only! Payment Options: FREE (donations accepted) - bring proof of income or support Best way to get seen: Call and schedule an appointment OR come at 8am on the 1st Monday of every month (except for holidays) when it is first come/first served.     [facility] [phone]   Location: [place], [place] Clinic Hours: Friday mornings Services, Payment Options, Best way to get seen: Call for info

## 2018-09-13 NOTE — ED Provider Notes (Signed)
[facility] Emergency Department Provider Note ____________________________________________  Time seen: 1226  I have reviewed the triage vital signs and the nursing notes.  HISTORY  Chief Complaint  Jaw Pain and Dental Pain  HPI Nathan Pena is a 28 y.o. male presents himself to the ED from work, for evaluation of sudden right lower jaw and dental pain. He reports pain to the right lower 3rd molar after eating this morning. He denies any fevers, chills, sweats, or nausea. He has no difficulty controlling oral secretions.   Past Medical History:  Diagnosis Date  . Asthma     There are no active problems to display for this patient.   History reviewed. No pertinent surgical history.  Prior to Admission medications   Medication Sig Start Date End Date Taking? Authorizing Provider  amoxicillin (AMOXIL) 500 MG capsule Take 1 capsule (500 mg total) by mouth 3 (three) times daily. 09/13/18   Arrianna Catala, Dannielle Karvonen, PA-C  pantoprazole (PROTONIX) 20 MG tablet Take 1 tablet (20 mg total) by mouth daily. 03/26/15 03/25/16  Daymon Larsen, MD    Allergies Patient has no known allergies.  No family history on file.  Social History Social History   Tobacco Use  . Smoking status: Current Every Day Smoker    Packs/day: 0.50  . Smokeless tobacco: Never Used  Substance Use Topics  . Alcohol use: Yes  . Drug use: No    Review of Systems  Constitutional: Negative for fever. Eyes: Negative for visual changes. ENT: Negative for sore throat. Dental/jaw pain as above Cardiovascular: Negative for chest pain. Respiratory: Negative for shortness of breath. Gastrointestinal: Negative for abdominal pain, vomiting and diarrhea. Musculoskeletal: Negative for back pain. Skin: Negative for rash. Neurological: Negative for headaches, focal weakness or numbness. ____________________________________________  PHYSICAL EXAM:  VITAL SIGNS: ED Triage Vitals  Enc Vitals  Group     BP 09/13/18 1133 (!) 158/88     Pulse Rate 09/13/18 1133 85     Resp 09/13/18 1133 16     Temp 09/13/18 1133 98.2 F (36.8 C)     Temp Source 09/13/18 1133 Oral     SpO2 09/13/18 1133 98 %     Weight 09/13/18 1134 200 lb (90.7 kg)     Height 09/13/18 1134 5\' 10"  (1.778 m)     Head Circumference --      Peak Flow --      Pain Score 09/13/18 1134 6     Pain Loc --      Pain Edu? --      Excl. in [place]? --     Constitutional: Alert and oriented. Well appearing and in no distress. Head: Normocephalic and atraumatic. Eyes: Conjunctivae are normal. PERRL. Normal extraocular movements Ears: Canals clear. TMs intact bilaterally. Nose: No congestion/rhinorrhea/epistaxis. Mouth/Throat: Mucous membranes are moist.  Uvula is midline and tonsils are flat.  No oropharyngeal lesions are appreciated.  No focal gum swelling, pointing, or fluctuance is noted around the right lower molars.  No brawny sublingual edema is appreciated. Neck: Supple. No thyromegaly. Hematological/Lymphatic/Immunological: No cervical lymphadenopathy. Cardiovascular: Normal rate, regular rhythm. Normal distal pulses. Respiratory: Normal respiratory effort. No wheezes/rales/rhonchi. Gastrointestinal: Soft and nontender. No distention. Musculoskeletal: Nontender with normal range of motion in all extremities.  Neurologic:  Normal gait without ataxia. Normal speech and language. No gross focal neurologic deficits are appreciated. Skin:  Skin is warm, dry and intact. No rash noted. ____________________________________________  PROCEDURES  Procedures ____________________________________________  INITIAL IMPRESSION /  ASSESSMENT AND PLAN / ED COURSE  Philis NettleMichael D Lequire was evaluated in Emergency Department on 09/13/2018 for the symptoms described in the history of present illness. He was evaluated in the context of the global COVID-19 pandemic, which necessitated consideration that the patient might be at risk for  infection with the SARS-CoV-2 virus that causes COVID-19. Institutional protocols and algorithms that pertain to the evaluation of patients at risk for COVID-19 are in a state of rapid change based on information released by regulatory bodies including the CDC and federal and state organizations. These policies and algorithms were followed during the patient's care in the ED.  Patient with ED evaluation of right lower jaw pain. His exam is benign without signs of acute dental abscess. He will be discharged with prescription for amoxicillin. He is further referred to any of the local dental clinics for evaluation.  ____________________________________________  FINAL CLINICAL IMPRESSION(S) / ED DIAGNOSES  Final diagnoses:  Pain, dental      Karmen StabsMenshew, Charlesetta IvoryJenise V Bacon, PA-C 09/13/18 1404    Minna AntisPaduchowski, Kevin, MD 09/13/18 1423

## 2018-09-13 NOTE — ED Notes (Signed)
See triage note  States he bit into a piece of bacon  Developed pain to right lower jaw   Min swelling noted

## 2019-08-19 ENCOUNTER — Other Ambulatory Visit: Payer: Self-pay

## 2019-08-19 ENCOUNTER — Emergency Department: Admission: EM | Admit: 2019-08-19 | Discharge: 2019-08-19 | Discharge status: Against Medical Advice | Payer: Self-pay

## 2019-08-19 NOTE — ED Triage Notes (Signed)
Called for triage no answer  

## 2019-08-19 NOTE — ED Triage Notes (Signed)
Cannot find pt, bathroom checked.

## 2019-08-19 NOTE — ED Triage Notes (Signed)
No answer

## 2020-05-04 ENCOUNTER — Emergency Department
Admission: EM | Admit: 2020-05-04 | Discharge: 2020-05-04 | Disposition: A | Discharge status: Home / Self Care | Payer: Self-pay | Attending: Emergency Medicine | Admitting: Emergency Medicine

## 2020-05-04 ENCOUNTER — Other Ambulatory Visit: Payer: Self-pay

## 2020-05-04 DIAGNOSIS — J45909 Unspecified asthma, uncomplicated: Secondary | ICD-10-CM | POA: Insufficient documentation

## 2020-05-04 DIAGNOSIS — F17210 Nicotine dependence, cigarettes, uncomplicated: Secondary | ICD-10-CM | POA: Insufficient documentation

## 2020-05-04 DIAGNOSIS — R6884 Jaw pain: Secondary | ICD-10-CM | POA: Insufficient documentation

## 2020-05-04 MED ORDER — AMOXICILLIN 875 MG PO TABS: 875.0000 mg | ORAL_TABLET | Freq: Two times a day (BID) | ORAL | 0 refills | Status: AC

## 2020-05-04 MED ORDER — KETOROLAC TROMETHAMINE 10 MG PO TABS: 10.0000 mg | ORAL_TABLET | Freq: Four times a day (QID) | ORAL | 0 refills | Status: AC | PRN

## 2020-05-04 MED ORDER — KETOROLAC TROMETHAMINE 30 MG/ML IJ SOLN
30.0000 mg | Freq: Once | INTRAMUSCULAR | Status: AC
  Administered 2020-05-04: 30 mg via INTRAMUSCULAR
  Filled 2020-05-04: qty 1

## 2020-05-04 NOTE — ED Provider Notes (Signed)
[facility]-EMERGENCY DEPARTMENT  ____________________________________________  Time seen: Approximately 4:26 PM  I have reviewed the triage vital signs and the nursing notes.   HISTORY  Chief Complaint Jaw Pain   Historian Patient     HPI Nathan Pena is a 30 y.o. male presents to the emergency department with left lower jaw pain.  Patient cannot localize pain to a specific tooth.  He states that he has had pain with chewing on the affected side.  States he is also had some ear discomfort within the past 1 to 2 days.  No fever or chills at home.  No trauma to the area.  Denies chest pain or chest tightness.  No similar instances of pain in the past.   Past Medical History:  Diagnosis Date  . Asthma      Immunizations up to date:  Yes.     Past Medical History:  Diagnosis Date  . Asthma     There are no problems to display for this patient.   No past surgical history on file.  Prior to Admission medications   Medication Sig Start Date End Date Taking? Authorizing Provider  amoxicillin (AMOXIL) 875 MG tablet Take 1 tablet (875 mg total) by mouth 2 (two) times daily for 10 days. 05/04/20 05/14/20 Yes Pia Mau M, PA-C  ketorolac (TORADOL) 10 MG tablet Take 1 tablet (10 mg total) by mouth every 6 (six) hours as needed for up to 5 days. 05/04/20 05/09/20 Yes Orvil Feil, PA-C    Allergies Patient has no known allergies.  No family history on file.  Social History Social History   Tobacco Use  . Smoking status: Current Every Day Smoker    Packs/day: 0.50  . Smokeless tobacco: Never Used  Vaping Use  . Vaping Use: Never used  Substance Use Topics  . Alcohol use: Yes  . Drug use: No     Review of Systems  Constitutional: No fever/chills Eyes:  No discharge. ENT: Patient has left jaw pain.  Respiratory: no cough. No SOB/ use of accessory muscles to breath Gastrointestinal:   No nausea, no vomiting.  No diarrhea.  No constipation. Musculoskeletal:  Negative for musculoskeletal pain. Skin: Negative for rash, abrasions, lacerations, ecchymosis.    ____________________________________________   PHYSICAL EXAM:  VITAL SIGNS: ED Triage Vitals  Enc Vitals Group     BP 05/04/20 1602 (!) 156/79     Pulse Rate 05/04/20 1602 78     Resp 05/04/20 1602 20     Temp 05/04/20 1602 98.5 F (36.9 C)     Temp Source 05/04/20 1602 Oral     SpO2 05/04/20 1602 99 %     Weight 05/04/20 1600 202 lb (91.6 kg)     Height 05/04/20 1600 5\' 10"  (1.778 m)     Head Circumference --      Peak Flow --      Pain Score 05/04/20 1600 7     Pain Loc --      Pain Edu? --      Excl. in GC? --      Constitutional: Alert and oriented. Well appearing and in no acute distress. Eyes: Conjunctivae are normal. PERRL. EOMI. Head: Atraumatic. ENT:      Ears: Left TM is erythematous and effused.      Nose: No congestion/rhinnorhea.      Mouth/Throat: Mucous membranes are moist.  No significant dental caries.  No pain underneath the tongue. Neck: No stridor.  No cervical spine tenderness  to palpation. Cardiovascular: Normal rate, regular rhythm. Normal S1 and S2.  Good peripheral circulation. Respiratory: Normal respiratory effort without tachypnea or retractions. Lungs CTAB. Good air entry to the bases with no decreased or absent breath sounds Gastrointestinal: Bowel sounds x 4 quadrants. Soft and nontender to palpation. No guarding or rigidity. No distention. Musculoskeletal: Full range of motion to all extremities. No obvious deformities noted Neurologic:  Normal for age. No gross focal neurologic deficits are appreciated.  Skin:  Skin is warm, dry and intact. No rash noted. Psychiatric: Mood and affect are normal for age. Speech and behavior are normal.   ____________________________________________   LABS (all labs ordered are listed, but only abnormal results are displayed)  Labs Reviewed - No data to  display ____________________________________________  EKG   ____________________________________________  RADIOLOGY   No results found.  ____________________________________________    PROCEDURES  Procedure(s) performed:     Procedures     Medications  ketorolac (TORADOL) 30 MG/ML injection 30 mg (has no administration in time range)     ____________________________________________   INITIAL IMPRESSION / ASSESSMENT AND PLAN / ED COURSE  Pertinent labs & imaging results that were available during my care of the patient were reviewed by me and considered in my medical decision making (see chart for details).       Assessment and plan Jaw pain 30 year old male presents to the emergency department with left lower jaw pain for the past 2 days.  Patient was hypertensive at triage but vital signs were otherwise reassuring.  Patient had no significant dental caries or swelling of the left lower jaw.  Physical exam findings suggest otitis media.  Patient was started on amoxicillin twice daily for 10 days and given an injection of Toradol for pain.  He was also discharged with oral Toradol.  Return precautions were given to return with new or worsening symptoms.    ____________________________________________  FINAL CLINICAL IMPRESSION(S) / ED DIAGNOSES  Final diagnoses:  Jaw pain      NEW MEDICATIONS STARTED DURING THIS VISIT:  ED Discharge Orders         Ordered    ketorolac (TORADOL) 10 MG tablet  Every 6 hours PRN        05/04/20 1621    amoxicillin (AMOXIL) 875 MG tablet  2 times daily        05/04/20 1622              This chart was dictated using voice recognition software/Dragon. Despite best efforts to proofread, errors can occur which can change the meaning. Any change was purely unintentional.     Orvil Feil, PA-C 05/04/20 1634    Delton Prairie, MD 05/04/20 2111

## 2020-05-04 NOTE — Discharge Instructions (Signed)
OPTIONS FOR DENTAL FOLLOW UP CARE ° °[place] Department of Health and Human Services - Local Safety Net Dental Clinics °http://www.ncdhhs.gov/dph/oralhealth/services/safetynetclinics.htm °  °[facility] (336-562-3123) ° °[facility] [place] ([phone]) ° °[facility] ([phone] ext [phone]) ° °[facility] ([phone]) ° °[facility] ([phone]) °This clinic caters to the indigent population and is on a lottery system. °Location: °[facility], [address], [place] °Clinic Hours: °Wednesdays from 6pm - 9pm, patients seen by a lottery system. °For dates, call or go to [email or web address]/[facility]/patients/[email or web address] °Services: °Cleanings, fillings and simple extractions. °Payment Options: °DENTAL WORK IS FREE OF CHARGE. Bring proof of income or support. °Best way to get seen: °Arrive at 5:15 pm - this is a lottery, NOT first come/first serve, so arriving earlier will not increase your chances of being seen. °  °  °[facility] °[phone] °Select option 1 for emergencies °  °Location: °[facility], [address], [place] °Clinic Hours: °No walk-ins accepted - call the day before to schedule an appointment. °Check in times are 9:30 am and 1:30 pm. °Services: °Simple extractions, temporary fillings, pulpectomy/pulp debridement, uncomplicated abscess drainage. °Payment Options: °PAYMENT IS DUE AT THE TIME OF SERVICE.  Fee is usually $100-200, additional surgical procedures (e.g. abscess drainage) may be extra. °Cash, checks, Visa/MasterCard accepted.  Can file Medicaid if patient is covered for dental - patient should call case worker to check. °No discount for [facility] patients. °Best way to get seen: °MUST call the day before and get onto the schedule. Can usually be seen the next 1-2 days. No walk-ins accepted. °  °  °[facility] °[phone] °   °Location: °[facility], [address], [place] °Clinic Hours: °M, W, Th, F 8am or 1:30pm, Tues 9a or 1:30 - first come/first served. °Services: °Simple extractions, temporary fillings, uncomplicated abscess drainage.  You do not need to be an [place] resident. °Payment Options: °PAYMENT IS DUE AT THE TIME OF SERVICE. °Dental insurance, otherwise sliding scale - bring proof of income or support. °Depending on income and treatment needed, cost is usually $50-200. °Best way to get seen: °Arrive early as it is first come/first served. °  °  °[facility] °[phone] °  °Location: °[address] °Clinic Hours: °Mon-Thu 8a-5p °Services: °Most basic dental services including extractions and fillings. °Payment Options: °PAYMENT IS DUE AT THE TIME OF SERVICE. °Sliding scale, up to 50% off - bring proof if income or support. °Medicaid with dental option accepted. °Best way to get seen: °Call to schedule an appointment, can usually be seen within 2 weeks OR they will try to see walk-ins - show up at 8a or 2p (you may have to wait). °  °  °[facility] °[phone] °[place] RESIDENTS ONLY °  °Location: °[facility], [address], [place], [place] [address] °Clinic Hours: By appointment only. °Monday - Thursday 8am-5pm, Friday 8am-12pm °Services: Cleanings, fillings, extractions. °Payment Options: °PAYMENT IS DUE AT THE TIME OF SERVICE. °Cash, Visa or MasterCard. Sliding scale - $30 minimum per service. °Best way to get seen: °Come in to office, complete packet and make an appointment - need proof of income °or support monies for each household member and proof of [place] residence. °Usually takes about a month to get in. °  °  °[facility] °[phone] °  °Location: °[address]   [place] °Clinic Hours: Walk-in Urgent Care Dental Services are offered Monday-Friday  mornings only. °The numbers of emergencies accepted daily is limited to the number of °providers available. °Maximum 15 - Mondays, Wednesdays & Thursdays °Maximum 10 - Tuesdays & Fridays °Services: °You do not need to be a [place] resident to be seen for a dental emergency. °Emergencies are defined as pain, swelling, abnormal bleeding, or dental trauma. Walkins will receive x-rays if needed. °NOTE: Dental cleaning is not an emergency. °Payment Options: °PAYMENT IS DUE AT THE TIME OF SERVICE. °Minimum co-pay is $40.00 for uninsured patients. °Minimum co-pay is $3.00 for Medicaid with dental coverage. °Dental Insurance is accepted and must be presented at time of visit. °Medicare does not cover dental. °Forms of payment: Cash, credit card, checks. °Best way to get seen: °If not previously registered with the clinic, walk-in dental registration begins at 7:15 am and is on a first come/first serve basis. °If previously registered with the clinic, call to make an appointment. °  °  °[facility] °[phone] °[place] RESIDENTS ONLY °  °Location: °[address], [place], [place] °Clinic Hours: °Mon-Thu 10a-2p °Services: Extractions only! °Payment Options: °FREE (donations accepted) - bring proof of income or support °Best way to get seen: °Call and schedule an appointment OR come at 8am on the 1st Monday of every month (except for holidays) when it is first come/first served. °  °  °[facility] °[phone] °  °Location: °[address], [place] °Clinic Hours: °Friday mornings °Services, Payment Options, Best way to get seen: °Call for info °

## 2020-05-04 NOTE — ED Triage Notes (Signed)
Pt to ED POV for left side jaw pain x 2 days with minimal swelling

## 2020-05-04 NOTE — ED Notes (Signed)
See triage note  Presents with left jaw pain  Denies any trauma  Min swelling noted   Increased pain when opening her mouth

## 2020-10-17 ENCOUNTER — Encounter: Payer: Self-pay | Admitting: Emergency Medicine

## 2020-10-17 ENCOUNTER — Emergency Department
Admission: EM | Admit: 2020-10-17 | Discharge: 2020-10-17 | Disposition: A | Discharge status: Home / Self Care | Payer: Self-pay | Attending: Emergency Medicine | Admitting: Emergency Medicine

## 2020-10-17 ENCOUNTER — Other Ambulatory Visit: Payer: Self-pay

## 2020-10-17 DIAGNOSIS — R509 Fever, unspecified: Secondary | ICD-10-CM | POA: Insufficient documentation

## 2020-10-17 DIAGNOSIS — J45909 Unspecified asthma, uncomplicated: Secondary | ICD-10-CM | POA: Insufficient documentation

## 2020-10-17 DIAGNOSIS — R0981 Nasal congestion: Secondary | ICD-10-CM | POA: Insufficient documentation

## 2020-10-17 DIAGNOSIS — F17210 Nicotine dependence, cigarettes, uncomplicated: Secondary | ICD-10-CM | POA: Insufficient documentation

## 2020-10-17 DIAGNOSIS — Z28310 Unvaccinated for covid-19: Secondary | ICD-10-CM | POA: Insufficient documentation

## 2020-10-17 DIAGNOSIS — Z20822 Contact with and (suspected) exposure to covid-19: Secondary | ICD-10-CM | POA: Insufficient documentation

## 2020-10-17 DIAGNOSIS — R059 Cough, unspecified: Secondary | ICD-10-CM | POA: Insufficient documentation

## 2020-10-17 LAB — RESP PANEL BY RT-PCR (FLU A&B, COVID) ARPGX2
Influenza A by PCR: NEGATIVE
Influenza B by PCR: NEGATIVE
SARS Coronavirus 2 by RT PCR: NEGATIVE

## 2020-10-17 NOTE — ED Provider Notes (Signed)
[facility] Emergency Department Provider Note  ____________________________________________   None    (approximate)  I have reviewed the triage vital signs and the nursing notes.   HISTORY  Chief Complaint Cough and Headache    HPI FINLEY CHEVEZ is a 30 y.o. male presents to the emergency department with URI symptoms for 3 days.   Is complaining of cough, congestion, fever, chills, denies chest pain, shortness of breath positive close contact with Covid19+ patient, patient is not vaccinated.   Past Medical History:  Diagnosis Date   Asthma     There are no problems to display for this patient.   History reviewed. No pertinent surgical history.  Prior to Admission medications   Not on File    Allergies Patient has no known allergies.  History reviewed. No pertinent family history.  Social History Social History   Tobacco Use   Smoking status: Every Day    Packs/day: 0.50    Types: Cigarettes   Smokeless tobacco: Never  Vaping Use   Vaping Use: Never used  Substance Use Topics   Alcohol use: Yes   Drug use: No    Review of Systems  Constitutional: Positive fever/chills Eyes: No visual changes. ENT: Denies sore throat. Respiratory: Positive cough Cardiovascular: Denies chest pain Gastrointestinal: Denies abdominal pain Genitourinary: Negative for dysuria. Musculoskeletal: Negative for back pain. Skin: Negative for rash. Neurological: Denies neurological changes    ____________________________________________   PHYSICAL EXAM:  VITAL SIGNS: ED Triage Vitals [10/17/20 1005]  Enc Vitals Group     BP (!) 134/102     Pulse Rate (!) 120     Resp 17     Temp 98.2 F (36.8 C)     Temp src      SpO2 100 %     Weight 201 lb 15.1 oz (91.6 kg)     Height 5\' 10"  (1.778 m)     Head Circumference      Peak Flow      Pain Score 8     Pain Loc      Pain Edu?      Excl. in GC?     Constitutional: Alert and oriented.  Well appearing and in no acute distress. Eyes: Conjunctivae are normal.  Head: Atraumatic. Nose: No congestion/rhinnorhea. Mouth/Throat: Mucous membranes are moist.   Neck:  supple no lymphadenopathy noted Cardiovascular: Normal rate, regular rhythm. Heart sounds are normal Respiratory: Normal respiratory effort.  No retractions, lungs CTA GU: deferred Musculoskeletal: FROM all extremities, warm and well perfused Neurologic:  Normal speech and language.  Skin:  Skin is warm, dry and intact. No rash noted. Psychiatric: Mood and affect are normal. Speech and behavior are normal.  ____________________________________________   LABS (all labs ordered are listed, but only abnormal results are displayed)  Labs Reviewed  RESP PANEL BY RT-PCR (FLU A&B, COVID) ARPGX2   ____________________________________________   ____________________________________________  RADIOLOGY    ____________________________________________   PROCEDURES  Procedure(s) performed: No  Procedures    ____________________________________________   INITIAL IMPRESSION / ASSESSMENT AND PLAN / ED COURSE  Pertinent labs & imaging results that were available during my care of the patient were reviewed by me and considered in my medical decision making (see chart for details).   Patient is a who complains of URI symptoms.  Exam is consistent with covid.    Negative test for covid/flu  Patient had been discharged prior testing results.  We will call him with test results to  inform him he is negative for COVID.  However due to the close contact symptoms I would say he is symptomatic for COVID.   The patient was instructed to quarantine themselves at home.  Follow-up with your regular doctor if any concerns.  Return emergency department for worsening. OTC measures discussed     JEDIDIAH DEMARTINI was evaluated in Emergency Department on 10/17/2020 for the symptoms described in the history of present illness.  He was evaluated in the context of the global COVID-19 pandemic, which necessitated consideration that the patient might be at risk for infection with the SARS-CoV-2 virus that causes COVID-19. Institutional protocols and algorithms that pertain to the evaluation of patients at risk for COVID-19 are in a state of rapid change based on information released by regulatory bodies including the CDC and federal and state organizations. These policies and algorithms were followed during the patient's care in the ED.   As part of my medical decision making, I reviewed the following data within the electronic MEDICAL RECORD NUMBER Nursing notes reviewed and incorporated, Labs reviewed , Old chart reviewed, Notes from prior ED visits, and [place] Controlled Substance Database  ____________________________________________   FINAL CLINICAL IMPRESSION(S) / ED DIAGNOSES  Final diagnoses:  Suspected COVID-19 virus infection      NEW MEDICATIONS STARTED DURING THIS VISIT:  There are no discharge medications for this patient.    Note:  This document was prepared using Dragon voice recognition software and may include unintentional dictation errors.    Faythe Ghee, PA-C 10/17/20 1516    Chesley Noon, MD 10/18/20 2329

## 2020-10-17 NOTE — ED Triage Notes (Signed)
Pt comes into the ED via POV c/o cough and headache.  Pt states he has been exposed to 2 people that had COVID.  Pt in NAD at this time with even and unlabored respirations.

## 2020-10-17 NOTE — Discharge Instructions (Signed)
Take over-the-counter vitamin C, vitamin D, and zinc to help boost your immune system Take over-the-counter Mucinex for cough as needed Take over-the-counter Tylenol and ibuprofen for fever and body aches Follow-up with your regular doctor as needed Return emergency department worsening Quarantine for 5 days if you are vaccinated and 7 days if you are not.  Quarantine is from onset of symptoms.  At that time you may return to school/work if your symptoms have improved and you have been fever free for 24 hours.  

## 2021-03-06 ENCOUNTER — Emergency Department
Admission: EM | Admit: 2021-03-06 | Discharge: 2021-03-06 | Disposition: A | Discharge status: Home / Self Care | Payer: Self-pay | Attending: Emergency Medicine | Admitting: Emergency Medicine

## 2021-03-06 ENCOUNTER — Other Ambulatory Visit: Payer: Self-pay

## 2021-03-06 DIAGNOSIS — B349 Viral infection, unspecified: Secondary | ICD-10-CM | POA: Insufficient documentation

## 2021-03-06 DIAGNOSIS — Z20822 Contact with and (suspected) exposure to covid-19: Secondary | ICD-10-CM | POA: Insufficient documentation

## 2021-03-06 LAB — RESP PANEL BY RT-PCR (FLU A&B, COVID) ARPGX2
Influenza A by PCR: NEGATIVE
Influenza B by PCR: NEGATIVE
SARS Coronavirus 2 by RT PCR: NEGATIVE

## 2021-03-06 MED ORDER — BENZONATATE 100 MG PO CAPS: 100.0000 mg | ORAL_CAPSULE | Freq: Three times a day (TID) | ORAL | 0 refills | Status: DC | PRN

## 2021-03-06 MED ORDER — ONDANSETRON 4 MG PO TBDP: 4.0000 mg | ORAL_TABLET | Freq: Three times a day (TID) | ORAL | 0 refills | Status: DC | PRN

## 2021-03-06 MED ORDER — PSEUDOEPH-BROMPHEN-DM 30-2-10 MG/5ML PO SYRP: 10.0000 mL | ORAL_SOLUTION | Freq: Four times a day (QID) | ORAL | 0 refills | Status: DC | PRN

## 2021-03-06 NOTE — ED Triage Notes (Signed)
Pt comes into the ED via POV c/o COVID symptoms with cough, body aches, fever, and headaches.  Pt in NAD at this time with even and unlabored respirations.  Symptoms started on Sunday evening.

## 2021-03-06 NOTE — ED Provider Notes (Signed)
[facility] Provider Note  Patient Contact: 4:22 PM (approximate)   History   Cough and Generalized Body Aches   HPI  Nathan Pena is a 31 y.o. male who presents the emergency department complaining of viral URI symptoms.  Patient has had headache, body aches, fevers, chills, nasal congestion, sore throat, cough, nausea, vomiting.  Symptoms have been ongoing x3 days.  The first 2 days were the worst and patient states that he has felt slightly better today.  Patient was able to eat and drink today though he is only been drinking the last 2 days.  No reports of diarrhea.  Patient states that he took 2 COVID test at home which were negative.  He states that primarily he needs a note for work as well as confirmation testing for COVID and flu.     Physical Exam   Triage Vital Signs: ED Triage Vitals [03/06/21 1501]  Enc Vitals Group     BP 102/77     Pulse Rate 95     Resp 16     Temp 98.6 F (37 C)     Temp Source Oral     SpO2 98 %     Weight 201 lb 15.1 oz (91.6 kg)     Height 5\' 10"  (1.778 m)     Head Circumference      Peak Flow      Pain Score 5     Pain Loc      Pain Edu?      Excl. in GC?     Most recent vital signs: Vitals:   03/06/21 1501 03/06/21 1502  BP: 102/77 102/77  Pulse: 95 89  Resp: 16 16  Temp: 98.6 F (37 C) 98.6 F (37 C)  SpO2: 98% 99%     General: Alert and in no acute distress. ENT:      Ears:       Nose: Moderate congestion/rhinnorhea.      Mouth/Throat: Mucous membranes are moist.  No gross oropharyngeal erythema or edema.  Uvula is midline. Hematological/Lymphatic/Immunilogical: No cervical lymphadenopathy. Cardiovascular:  Good peripheral perfusion Respiratory: Normal respiratory effort without tachypnea or retractions. Lungs CTAB. Good air entry to the bases with no decreased or absent breath sounds. Gastrointestinal: Bowel sounds 4 quadrants. Soft and nontender to palpation. No guarding or rigidity. No  palpable masses. No distention.  Musculoskeletal: Full range of motion to all extremities.  Neurologic:  No gross focal neurologic deficits are appreciated.  Skin:   No rash noted Other:   ED Results / Procedures / Treatments   Labs (all labs ordered are listed, but only abnormal results are displayed) Labs Reviewed  RESP PANEL BY RT-PCR (FLU A&B, COVID) ARPGX2     EKG     RADIOLOGY    No results found.  PROCEDURES:  Critical Care performed: No  Procedures   MEDICATIONS ORDERED IN ED: Medications - No data to display   IMPRESSION / MDM / ASSESSMENT AND PLAN / ED COURSE  I reviewed the triage vital signs and the nursing notes.                              Differential diagnosis includes, but is not limited to, viral illness, flu, COVID, viral gastroenteritis   Patient's diagnosis is consistent with viral illness.  Patient presented to the emergency department with viral symptoms.  Negative for flu and COVID by PCR testing.  Patient is improving symptomatically.  At this time no indication for further work-up.  Tylenol, Motrin, fluids at home.  Patient will be prescribed antiemetic, and to cough medications for symptom improvement.  Follow-up primary care as needed.  Return precautions discussed with the patient..  Patient is given ED precautions to return to the ED for any worsening or new symptoms.        FINAL CLINICAL IMPRESSION(S) / ED DIAGNOSES   Final diagnoses:  Viral illness     Rx / DC Orders   ED Discharge Orders          Ordered    ondansetron (ZOFRAN-ODT) 4 MG disintegrating tablet  Every 8 hours PRN        03/06/21 1637    benzonatate (TESSALON PERLES) 100 MG capsule  3 times daily PRN        03/06/21 1637    brompheniramine-pseudoephedrine-DM 30-2-10 MG/5ML syrup  4 times daily PRN        03/06/21 1637             Note:  This document was prepared using Dragon voice recognition software and may include unintentional dictation  errors.   Lanette Hampshire 03/06/21 1642    Concha Se, MD 03/06/21 2011

## 2021-07-25 ENCOUNTER — Emergency Department
Admission: EM | Admit: 2021-07-25 | Discharge: 2021-07-26 | Disposition: A | Discharge status: Other Acute Inpatient Hospital | Payer: Self-pay | Attending: Emergency Medicine | Admitting: Emergency Medicine

## 2021-07-25 ENCOUNTER — Other Ambulatory Visit: Payer: Self-pay

## 2021-07-25 DIAGNOSIS — F1910 Other psychoactive substance abuse, uncomplicated: Secondary | ICD-10-CM | POA: Insufficient documentation

## 2021-07-25 DIAGNOSIS — Z79899 Other long term (current) drug therapy: Secondary | ICD-10-CM | POA: Insufficient documentation

## 2021-07-25 DIAGNOSIS — S61512A Laceration without foreign body of left wrist, initial encounter: Secondary | ICD-10-CM | POA: Insufficient documentation

## 2021-07-25 DIAGNOSIS — S61519A Laceration without foreign body of unspecified wrist, initial encounter: Secondary | ICD-10-CM

## 2021-07-25 DIAGNOSIS — Y92812 Truck as the place of occurrence of the external cause: Secondary | ICD-10-CM | POA: Insufficient documentation

## 2021-07-25 DIAGNOSIS — F332 Major depressive disorder, recurrent severe without psychotic features: Secondary | ICD-10-CM | POA: Insufficient documentation

## 2021-07-25 DIAGNOSIS — Z20822 Contact with and (suspected) exposure to covid-19: Secondary | ICD-10-CM | POA: Insufficient documentation

## 2021-07-25 DIAGNOSIS — X789XXA Intentional self-harm by unspecified sharp object, initial encounter: Secondary | ICD-10-CM | POA: Insufficient documentation

## 2021-07-25 DIAGNOSIS — R45851 Suicidal ideations: Secondary | ICD-10-CM

## 2021-07-25 LAB — SARS CORONAVIRUS 2 BY RT PCR: SARS Coronavirus 2 by RT PCR: NEGATIVE

## 2021-07-25 LAB — COMPREHENSIVE METABOLIC PANEL
ALT: 32 U/L (ref 0–44)
AST: 26 U/L (ref 15–41)
Albumin: 4.2 g/dL (ref 3.5–5.0)
Alkaline Phosphatase: 69 U/L (ref 38–126)
Anion gap: 8 (ref 5–15)
BUN: 15 mg/dL (ref 6–20)
CO2: 25 mmol/L (ref 22–32)
Calcium: 9 mg/dL (ref 8.9–10.3)
Chloride: 104 mmol/L (ref 98–111)
Creatinine, Ser: 0.93 mg/dL (ref 0.61–1.24)
GFR, Estimated: >60 mL/min (ref >60)
Glucose, Bld: 105 mg/dL — ABNORMAL HIGH (ref 70–99)
Potassium: 3.2 mmol/L — ABNORMAL LOW (ref 3.5–5.1)
Sodium: 137 mmol/L (ref 135–145)
Total Bilirubin: 0.9 mg/dL (ref 0.3–1.2)
Total Protein: 7.4 g/dL (ref 6.5–8.1)

## 2021-07-25 LAB — CBC
HCT: 46.4 % (ref 39.0–52.0)
Hemoglobin: 15.6 g/dL (ref 13.0–17.0)
MCH: 28.9 pg (ref 26.0–34.0)
MCHC: 33.6 g/dL (ref 30.0–36.0)
MCV: 86.1 fL (ref 80.0–100.0)
Platelets: 312 10*3/uL (ref 150–400)
RBC: 5.39 MIL/uL (ref 4.22–5.81)
RDW: 12.4 % (ref 11.5–15.5)
WBC: 9 10*3/uL (ref 4.0–10.5)
nRBC: 0 % (ref 0.0–0.2)

## 2021-07-25 LAB — SALICYLATE LEVEL: Salicylate Lvl: <7 mg/dL — ABNORMAL LOW (ref 7.0–30.0)

## 2021-07-25 LAB — ETHANOL: Alcohol, Ethyl (B): <10 mg/dL (ref <10)

## 2021-07-25 LAB — ACETAMINOPHEN LEVEL: Acetaminophen (Tylenol), Serum: <10 ug/mL — ABNORMAL LOW (ref 10–30)

## 2021-07-25 NOTE — ED Triage Notes (Signed)
Pt to ED under IVC from [facility]. IVC paper works [place]. Pt states stressors from no job, living situation and family. Papers state pt had a machete and axe. 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 Pt reports he is homeless. Paperwork aslo reports he was planning suicide by cop but was stopped by family. Pt states I just "snapped". Pt reports he is not med compliant. Pt left wrist covered with bandage.   Pt belongings as follows: 1 blue shirt  1 gray shirt  1 pair brown boots

## 2021-07-25 NOTE — ED Provider Notes (Signed)
[facility] Provider Note    Event Date/Time   First MD Initiated Contact with Patient 07/25/21 2118     (approximate)   History   Suicidal ideation   HPI  Nathan Pena is a 31 y.o. male who presents to the emergency department today because of concerns for suicidal ideation.  The patient states that he has been feeling increasingly bad over the past couple of days.  When asked if anything specific happened he said "life".  Patient denies suicidal attempts in the past.  Has never spoken to a psychiatrist or therapist.  Patient did slit his left wrist.  Also states he trashed his car.      Physical Exam   Triage Vital Signs: ED Triage Vitals  Enc Vitals Group     BP 07/25/21 1838 114/80     Pulse Rate 07/25/21 1838 (!) 57     Resp 07/25/21 1838 18     Temp 07/25/21 1838 97.7 F (36.5 C)     Temp Source 07/25/21 1838 Oral     SpO2 07/25/21 1838 98 %     Weight 07/25/21 1837 198 lb (89.8 kg)     Height 07/25/21 1837 5\' 10"  (1.778 m)     Head Circumference --      Peak Flow --      Pain Score 07/25/21 1837 0     Pain Loc --      Pain Edu? --      Excl. in GC? --     Most recent vital signs: Vitals:   07/25/21 1838  BP: 114/80  Pulse: (!) 57  Resp: 18  Temp: 97.7 F (36.5 C)  SpO2: 98%    General: Awake, alert, oriented. CV:  Good peripheral perfusion.  Resp:  Normal effort.  Psych:  Depressed Skin:  Superficial lacerations to left wrist. Hemostatic   ED Results / Procedures / Treatments   Labs (all labs ordered are listed, but only abnormal results are displayed) Labs Reviewed  COMPREHENSIVE METABOLIC PANEL - Abnormal; Notable for the following components:      Result Value   Potassium 3.2 (*)    Glucose, Bld 105 (*)    All other components within normal limits  SALICYLATE LEVEL - Abnormal; Notable for the following components:   Salicylate Lvl <7.0 (*)    All other components within normal limits  ACETAMINOPHEN LEVEL -  Abnormal; Notable for the following components:   Acetaminophen (Tylenol), Serum <10 (*)    All other components within normal limits  SARS CORONAVIRUS 2 BY RT PCR  ETHANOL  CBC  URINE DRUG SCREEN, QUALITATIVE ([facility] ONLY)     EKG  None   RADIOLOGY None   PROCEDURES:  Critical Care performed: No  Procedures   MEDICATIONS ORDERED IN ED: Medications - No data to display   IMPRESSION / MDM / ASSESSMENT AND PLAN / ED COURSE  I reviewed the triage vital signs and the nursing notes.                              Differential diagnosis includes, but is not limited to, SI, depression, substance use mood disorder.  Patient's presentation is most consistent with acute presentation with potential threat to life or bodily function.  Patient presents to the emergency department today because of concerns for suicidal ideation.  Patient did slit his left wrist.  On exam patient has very superficial  lacerations to his left wrist.  No requirement for advanced closure.  Patient was brought in under IVC and I will continue IVC.  Will have psychiatry evaluate.  The patient has been placed in psychiatric observation due to the need to provide a safe environment for the patient while obtaining psychiatric consultation and evaluation, as well as ongoing medical and medication management to treat the patient's condition.  The patient has been placed under full IVC at this time.    FINAL CLINICAL IMPRESSION(S) / ED DIAGNOSES   Final diagnoses:  Suicidal ideation  Self-cutting of wrist [facility])     Note:  This document was prepared using Dragon voice recognition software and may include unintentional dictation errors.    Phineas Semen, MD 07/25/21 2134

## 2021-07-25 NOTE — ED Notes (Signed)
IVC/pending psych consult 

## 2021-07-26 ENCOUNTER — Inpatient Hospital Stay
Admission: AD | Admit: 2021-07-26 | Discharge: 2021-07-29 | DRG: 885 | Disposition: A | Discharge status: Home / Self Care | Payer: 59 | Source: Intra-hospital | Attending: Psychiatry | Admitting: Psychiatry

## 2021-07-26 ENCOUNTER — Encounter: Payer: Self-pay | Admitting: Psychiatry

## 2021-07-26 DIAGNOSIS — G4700 Insomnia, unspecified: Secondary | ICD-10-CM | POA: Diagnosis present

## 2021-07-26 DIAGNOSIS — F1910 Other psychoactive substance abuse, uncomplicated: Secondary | ICD-10-CM

## 2021-07-26 DIAGNOSIS — F17210 Nicotine dependence, cigarettes, uncomplicated: Secondary | ICD-10-CM | POA: Diagnosis present

## 2021-07-26 DIAGNOSIS — Z20822 Contact with and (suspected) exposure to covid-19: Secondary | ICD-10-CM | POA: Diagnosis present

## 2021-07-26 DIAGNOSIS — X789XXA Intentional self-harm by unspecified sharp object, initial encounter: Secondary | ICD-10-CM | POA: Diagnosis present

## 2021-07-26 DIAGNOSIS — F332 Major depressive disorder, recurrent severe without psychotic features: Secondary | ICD-10-CM | POA: Diagnosis present

## 2021-07-26 LAB — URINE DRUG SCREEN, QUALITATIVE (ARMC ONLY)
Amphetamines, Ur Screen: POSITIVE — AB
Barbiturates, Ur Screen: NOT DETECTED
Benzodiazepine, Ur Scrn: NOT DETECTED
Cannabinoid 50 Ng, Ur ~~LOC~~: NOT DETECTED
Cocaine Metabolite,Ur ~~LOC~~: NOT DETECTED
MDMA (Ecstasy)Ur Screen: NOT DETECTED
Methadone Scn, Ur: NOT DETECTED
Opiate, Ur Screen: NOT DETECTED
Phencyclidine (PCP) Ur S: NOT DETECTED
Tricyclic, Ur Screen: NOT DETECTED

## 2021-07-26 MED ORDER — MAGNESIUM HYDROXIDE 400 MG/5ML PO SUSP: 30.0000 mL | Freq: Every day | ORAL | Status: DC | PRN

## 2021-07-26 MED ORDER — ACETAMINOPHEN 325 MG PO TABS: 650.0000 mg | ORAL_TABLET | Freq: Four times a day (QID) | ORAL | Status: DC | PRN

## 2021-07-26 MED ORDER — ALUM & MAG HYDROXIDE-SIMETH 200-200-20 MG/5ML PO SUSP: 30.0000 mL | ORAL | Status: DC | PRN

## 2021-07-26 NOTE — BH Assessment (Signed)
Comprehensive Clinical Assessment (CCA) Note  07/26/2021 Nathan Pena [id number]  Chief Complaint: No chief complaint on file.  Visit Diagnosis: MDD (major depressive disorder), recurrent episode, severe (HCC)   Suicide attempt by cutting of wrist (HCC)   Substance abuse (HCC)     Recommendations for Services/Supports/Treatments: Consulted with Madaline Brilliant., NP, who recommended pt for inpatient treatment. Notified Dr. Derrill Kay and Nelva Nay, RN of disposition recommendation.   Nathan Fells. Nathan Pena "Nathan Pena" is a 31 year old, English speaking, Caucasian male with a hx of MDD recurrent severe. Pt presented to [facility] ED under IVC from [facility]. Per triage note: Pt to ED under IVC from [facility]. IVC paper works that pt. [place] and [place]. Pt states stressors from no job, living situation and family. Papers state pt. had a machete and axe. 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 Pt reports he is homeless. Paperwork also reports he was planning suicide by cop but was stopped by family. Pt states I just "snapped". Pt reports he is not med compliant. Upon assessment Pt stated, "I lost it. I'm at a turning point in my life". Pt reported that he'd been kicked out of his mother's house due to his mother not liking his fianc/mother of his children. Pt reported that he has missed work 2 days due to a lack of gas money. Pt reported that he has not had access to food for 2 days and has been feeling increasingly hopeless and contributed to his SI. Pt reported that his 3 children are at his mother's house and his wife is driving around in their truck. Pt reported that he used meth 6/7 days ago and admitted to cannabis use 3 days ago. Pt had flashes of insight and admitted to feeling overwhelmed. Pt expressed a readiness for change and to work towards recovery. Pt was cooperative and forthcoming throughout the assessment. Pt had clear and coherent speech. Pt was oriented x4. Pt had a depressed  mood and a sad affect. The pt. did not appear to be responding to internal/external stimuli. The patient denied current SI, HI or AV/H. Pt reported that he initially wanted to commit suicide but identified his wife and kids as anchors. Pt's UDS is pending and BAL was unremarkable.  CCA Screening, Triage and Referral (STR)  Patient Reported Information How did you hear about [place]? Self  Referral name: No data recorded Referral phone number: No data recorded  Whom do you see for routine medical problems? No data recorded Practice/Facility Name: No data recorded Practice/Facility Phone Number: No data recorded Name of Contact: No data recorded Contact Number: No data recorded Contact Fax Number: No data recorded Prescriber Name: No data recorded Prescriber Address (if known): No data recorded  What Is the Reason for Your Visit/Call Today? Pt to ED under IVC from [facility]. IVC paper works that pt [place] and [place] his [place]. Pt states stressors from no job, living situation and family. Papers state pt had a machete and axe. 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 Pt reports he is homeless. Paperwork aslo reports he was planning suicide by cop but was stopped by family. Pt states I just "snapped".  How Long Has This Been Causing You Problems? <Week  What Do You Feel Would Help You the Most Today? Stress Management; Treatment for Depression or other mood problem   Have You Recently Been in Any  Inpatient Treatment (Hospital/Detox/Crisis Center/28-Day Program)? No data recorded Name/Location of Program/Hospital:No data recorded How Long Were You There? No data recorded When Were You Discharged? No data recorded  Have You Ever Received Services From [facility] Before? No data recorded Who Do You See at [facility]? No data recorded  Have You Recently Had Any Thoughts About Hurting Yourself? Yes  Are You Planning to Commit Suicide/Harm Yourself At This  time? No   Have you Recently Had Thoughts About [place]? No  Explanation: No data recorded  Have You Used Any Alcohol or Drugs in the Past 24 Hours? No  How Long Ago Did You Use Drugs or Alcohol? No data recorded What Did You Use and How Much? No data recorded  Do You Currently Have a Therapist/Psychiatrist? No  Name of Therapist/Psychiatrist: No data recorded  Have You Been Recently Discharged From Any Office Practice or Programs? No  Explanation of Discharge From Practice/Program: No data recorded    CCA Screening Triage Referral Assessment Type of Contact: Face-to-Face  Is this Initial or Reassessment? No data recorded Date Telepsych consult ordered in [facility]:  No data recorded Time Telepsych consult ordered in [facility]:  No data recorded  Patient Reported Information Reviewed? No data recorded Patient Left Without Being Seen? No data recorded Reason for Not Completing Assessment: No data recorded  Collateral Involvement: None provided   Does Patient Have a Sloan? No data recorded Name and Contact of Legal Guardian: No data recorded If Minor and Not Living with Parent(s), Who has Custody? n/a  Is CPS involved or ever been involved? Never  Is APS involved or ever been involved? Never   Patient Determined To Be At Risk for Harm To Self or Others Based on Review of Patient Reported Information or Presenting Complaint? Yes, for Self-Harm  Method: No data recorded Availability of Means: No data recorded Intent: No data recorded Notification Required: No data recorded Additional Information for Danger to Others Potential: No data recorded Additional Comments for Danger to Others Potential: No data recorded Are There Guns or Other Weapons in Your Home? No data recorded Types of Guns/Weapons: No data recorded Are These Weapons Safely Secured?                            No data recorded Who Could Verify You Are Able To Have These Secured:  No data recorded Do You Have any Outstanding Charges, Pending Court Dates, Parole/Probation? No data recorded Contacted To Inform of Risk of Harm To Self or Others: No data recorded  Location of Assessment: No data recorded  Does Patient Present under Involuntary Commitment? Yes  IVC Papers Initial File Date: 07/25/21   [place] of Residence: [place]   Patient Currently Receiving the Following Services: Not Receiving Services   Determination of Need: Emergent (2 hours)   Options For Referral: Inpatient Hospitalization     CCA Biopsychosocial Intake/Chief Complaint:  No data recorded Current Symptoms/Problems: No data recorded  Patient Reported Schizophrenia/Schizoaffective Diagnosis in Past: No   Strengths: Pt is receptive to treatment; pt is physically healthy  Preferences: No data recorded Abilities: No data recorded  Type of Services Patient Feels are Needed: No data recorded  Initial Clinical Notes/Concerns: No data recorded  Mental Health Symptoms Depression:   Hopelessness   Duration of Depressive symptoms:  Greater than two weeks   Mania:   N/A   Anxiety:    Worrying; Tension  Psychosis:   None   Duration of Psychotic symptoms: No data recorded  Trauma:   Re-experience of traumatic event; N/A   Obsessions:   Good insight; Recurrent & persistent thoughts/impulses/images; Attempts to suppress/neutralize; Cause anxiety; Disrupts routine/functioning; Intrusive/time consuming   Compulsions:   "Driven" to perform behaviors/acts; Repeated behaviors/mental acts; Intended to reduce stress or prevent another outcome; Disrupts with routine/functioning; Good insight   Inattention:   None   Hyperactivity/Impulsivity:   None   Oppositional/Defiant Behaviors:   N/A   Emotional Irregularity:   Potentially harmful impulsivity   Other Mood/Personality Symptoms:  No data recorded   Mental Status Exam Appearance and self-care  Stature:    Average   Weight:   Average weight   Clothing:   -- (In scrubs)   Grooming:   Neglected   Cosmetic use:   None   Posture/gait:   Normal   Motor activity:   Not Remarkable   Sensorium  Attention:   Normal   Concentration:   Normal   Orientation:   Place; Situation; Person; Object   Recall/memory:   Normal   Affect and Mood  Affect:   Depressed   Mood:   Depressed   Relating  Eye contact:   Normal   Facial expression:   Sad   Attitude toward examiner:   Cooperative   Thought and Language  Speech flow:  Clear and Coherent   Thought content:   Appropriate to Mood and Circumstances   Preoccupation:   Ruminations   Hallucinations:   None   Organization:  No data recorded  [employer] of Knowledge:   Average   Intelligence:   Average   Abstraction:   Functional   Judgement:   Poor   Reality Testing:   Adequate   Insight:   Lacking   Decision Making:   Impulsive   Social Functioning  Social Maturity:   Irresponsible   Social Judgement:   Impropriety   Stress  Stressors:   Family conflict; Housing; Transitions   Coping Ability:   Exhausted; Deficient supports   Skill Deficits:   Decision making; Self-control; Responsibility   Supports:   Support needed     Religion: Religion/Spirituality Are You A Religious Person?:  (Not assessed) How Might This Affect Treatment?: UTA  Leisure/Recreation: Leisure / Recreation Do You Have Hobbies?: No  Exercise/Diet: Exercise/Diet Do You Exercise?: No Have You Gained or Lost A Significant Amount of Weight in the Past Six Months?: No Do You Follow a Special Diet?: No Do You Have Any Trouble Sleeping?: Yes Explanation of Sleeping Difficulties: Pt reported that his back pain causes sleep disturbance   CCA Employment/Education Employment/Work Situation: Employment / Work Situation Employment Situation: Employed Work Stressors: None reported Patient's  Job has Been Impacted by Current Illness: Yes Describe how Patient's Job has Been Impacted: Pt reported that he has not gone to work in 2 days due to not having gas money Has Patient ever Been in the [employer]?: No  Education: Education Is Patient Currently Attending School?: No Did Physicist, medical?: No Did You Have An Individualized Education Program (IIEP): No Did You Have Any Difficulty At [employer]?: No Patient's Education Has Been Impacted by Current Illness: No   CCA Family/Childhood History Family and Relationship History: Family history Marital status: Long term relationship Long term relationship, how long?: Pt reported that he is engaged What types of issues is patient dealing with in the relationship?: Pt and fiance were recently put out of pt's  mother's house. Does patient have children?: Yes How many children?: 3 How is patient's relationship with their children?: Pt's children are with pt's mother and the pt and the their mother do not have stable housing.  Childhood History:  Childhood History By whom was/is the patient raised?: Mother Did patient suffer any verbal/emotional/physical/sexual abuse as a child?:  (UTA) Did patient suffer from severe childhood neglect?:  (UTA) Has patient ever been sexually abused/assaulted/raped as an adolescent or adult?:  (UTA) Was the patient ever a victim of a crime or a disaster?: No Witnessed domestic violence?: No Has patient been affected by domestic violence as an adult?: No  Child/Adolescent Assessment:     CCA Substance Use Alcohol/Drug Use: Alcohol / Drug Use Pain Medications: See MAR Prescriptions: See MAR Over the Counter: See MAR History of alcohol / drug use?: Yes Longest period of sobriety (when/how long): Unknown Negative Consequences of Use: Personal relationships, Financial, Work / [employer]                         ASAM's:  Six Dimensions of Multidimensional Assessment  Dimension 1:  Acute  Intoxication and/or Withdrawal Potential:   Dimension 1:  Description of individual's past and current experiences of substance use and withdrawal: Pt reported having a hx of Meth and Cannabis; pt not acutely intoxicated or withdrawing.  Dimension 2:  Biomedical Conditions and Complications:      Dimension 3:  Emotional, Behavioral, or Cognitive Conditions and Complications:     Dimension 4:  Readiness to Change:     Dimension 5:  Relapse, Continued use, or Continued Problem Potential:     Dimension 6:  Recovery/Living Environment:     ASAM Severity Score: ASAM's Severity Rating Score: 14  ASAM Recommended Level of Treatment: ASAM Recommended Level of Treatment: Level II Intensive Outpatient Treatment   Substance use Disorder (SUD) Substance Use Disorder (SUD)  Checklist Symptoms of Substance Use: Continued use despite having a persistent/recurrent physical/psychological problem caused/exacerbated by use, Continued use despite persistent or recurrent social, interpersonal problems, caused or exacerbated by use, Recurrent use that results in a failure to fulfill major role obligations (work, school, home)  Recommendations for Services/Supports/Treatments: Recommendations for Services/Supports/Treatments Recommendations For Services/Supports/Treatments: SAIOP (Substance Abuse Intensive Outpatient Program), Individual Therapy, Peer Support Services, Medication Management  DSM5 Diagnoses: Patient Active Problem List   Diagnosis Date Noted   MDD (major depressive disorder), recurrent episode, severe (HCC) 07/26/2021   Suicide attempt by cutting of wrist (HCC) 07/26/2021   Substance abuse (HCC) 07/26/2021    Journey Ratterman R Arelys Glassco, LCAS

## 2021-07-26 NOTE — Consult Note (Signed)
[facility] Consult   Reason for Consult:Suicidal ideation Referring Physician: Dr. Archie Balboa Patient Identification: Nathan Pena MRN:  [id number] Principal Diagnosis: <principal problem not specified> Diagnosis:  Active Problems:   MDD (major depressive disorder), recurrent episode, severe ([place])   Suicide attempt by cutting of wrist ([place])   Substance abuse ([place])   Total Time spent with patient: 1 hour  Subjective: "I woke-up this morning and I just could not deal with everything  anymore." Nathan Pena is a 31 y.o. male patient presented to [facility] ED via law enforcement under involuntary commitment status (IVC) from [place]. Per the ED triage nurses note, Pt to ED under IVC from [place]. IVC paper works that [place]. Pt states stressors from no job, living situation and family. Papers state pt had a machete and axe. 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 Pt reports he is homeless. Paperwork aslo reports he was planning suicide by cop but was stopped by family. Pt states I just "snapped". Pt reports he is not med compliant. Pt left wrist covered with bandage.   The patient stated that he woke up this morning and was tired of his life situations. He voiced that he has three children; they live in his mom's home with her and their kids. He shared that his mom does not like his fiancee, and she put them out of her home, but the kids were allowed to stay with her. The patient stated he did not have money to buy gas for work. The patient minimized his substance use. He shared that he had used meth six days ago. The patient states that his fiancee is living in their truck while he is in the hospital.   This provider saw The patient face-to-face; the chart was reviewed, and consulted with Dr. Archie Balboa on 07/25/2021 due to the patient's care. It was discussed with the EDP that the patient does meet the criteria to be admitted to the  psychiatric inpatient unit.  On evaluation, the patient is alert and oriented x 4, calm, cooperative, and mood-congruent with affect. The patient does not appear to be responding to internal or external stimuli. Neither is the patient presenting with any delusional thinking. The patient denies auditory or visual hallucinations. The patient denies any suicidal, homicidal, or self-harm ideations. The patient is not presenting with any psychotic or paranoid behaviors. During an encounter with the patient, he could answer questions appropriately.  HPI: Per Dr. Archie Balboa, Nathan Pena is a 31 y.o. male who presents to the emergency department today because of concerns for suicidal ideation.  The patient states that he has been feeling increasingly bad over the past couple of days.  When asked if anything specific happened he said "life".  Patient denies suicidal attempts in the past.  Has never spoken to a psychiatrist or therapist.  Patient did slit his left wrist.  Also states he trashed his car.  Past Psychiatric History: History reviewed. No pertinent past psychiatric history  Risk to Self:   Risk to Others:   Prior Inpatient Therapy:   Prior Outpatient Therapy:    Past Medical History:  Past Medical History:  Diagnosis Date   Asthma    History reviewed. No pertinent surgical history. Family History: History reviewed. No pertinent family history. Family Psychiatric  History:  Social History:  Social History   Substance and Sexual Activity  Alcohol Use Yes  Social History   Substance and Sexual Activity  Drug Use No    Social History   Socioeconomic History   Marital status: Single    Spouse name: Not on file   Number of children: Not on file   Years of education: Not on file   Highest education level: Not on file  Occupational History   Not on file  Tobacco Use   Smoking status: Every Day    Packs/day: 0.50    Types: Cigarettes   Smokeless tobacco: Never  Vaping Use    Vaping Use: Never used  Substance and Sexual Activity   Alcohol use: Yes   Drug use: No   Sexual activity: Not on file  Other Topics Concern   Not on file  Social History Narrative   Not on file   Social Determinants of Health   Financial Resource Strain: Not on file  Food Insecurity: Not on file  Transportation Needs: Not on file  Physical Activity: Not on file  Stress: Not on file  Social Connections: Not on file   Additional Social History:    Allergies:  No Known Allergies  Labs:  Results for orders placed or performed during the hospital encounter of 07/25/21 (from the past 48 hour(s))  Comprehensive metabolic panel     Status: Abnormal   Collection Time: 07/25/21  6:42 PM  Result Value Ref Range   Sodium 137 135 - 145 mmol/L   Potassium 3.2 (L) 3.5 - 5.1 mmol/L   Chloride 104 98 - 111 mmol/L   CO2 25 22 - 32 mmol/L   Glucose, Bld 105 (H) 70 - 99 mg/dL    Comment: Glucose reference range applies only to samples taken after fasting for at least 8 hours.   BUN 15 6 - 20 mg/dL   Creatinine, Ser 1.44 0.61 - 1.24 mg/dL   Calcium 9.0 8.9 - 81.8 mg/dL   Total Protein 7.4 6.5 - 8.1 g/dL   Albumin 4.2 3.5 - 5.0 g/dL   AST 26 15 - 41 U/L   ALT 32 0 - 44 U/L   Alkaline Phosphatase 69 38 - 126 U/L   Total Bilirubin 0.9 0.3 - 1.2 mg/dL   GFR, Estimated >56 >31 mL/min    Comment: (NOTE) Calculated using the CKD-EPI Creatinine Equation (2021)    Anion gap 8 5 - 15    Comment: Performed at [facility], [address], [place], [place] [address]  Ethanol     Status: None   Collection Time: 07/25/21  6:42 PM  Result Value Ref Range   Alcohol, Ethyl (B) <10 <10 mg/dL    Comment: (NOTE) Lowest detectable limit for serum alcohol is 10 mg/dL.  For medical purposes only. Performed at [facility], [address] [place], [place] [address]   Salicylate level     Status: Abnormal   Collection Time: 07/25/21  6:42 PM  Result Value Ref Range    Salicylate Lvl <7.0 (L) 7.0 - 30.0 mg/dL    Comment: Performed at [facility], [address] [place], [place] [address]  Acetaminophen level     Status: Abnormal   Collection Time: 07/25/21  6:42 PM  Result Value Ref Range   Acetaminophen (Tylenol), Serum <10 (L) 10 - 30 ug/mL    Comment: (NOTE) Therapeutic concentrations vary significantly. A range of 10-30 ug/mL  may be an effective concentration for many patients. However, some  are best treated at concentrations outside of this range. Acetaminophen  concentrations >150 ug/mL at 4 hours after ingestion  and >50 ug/mL at 12 hours after ingestion are often associated with  toxic reactions.  Performed at [facility], [address] [place], [place] [address]   cbc     Status: None   Collection Time: 07/25/21  6:42 PM  Result Value Ref Range   WBC 9.0 4.0 - 10.5 K/uL   RBC 5.39 4.22 - 5.81 MIL/uL   Hemoglobin 15.6 13.0 - 17.0 g/dL   HCT 46.4 39.0 - 52.0 %   MCV 86.1 80.0 - 100.0 fL   MCH 28.9 26.0 - 34.0 pg   MCHC 33.6 30.0 - 36.0 g/dL   RDW 12.4 11.5 - 15.5 %   Platelets 312 150 - 400 K/uL   nRBC 0.0 0.0 - 0.2 %    Comment: Performed at [facility], [address] [place], [place] [address]  SARS Coronavirus 2 by RT PCR (hospital order, performed in [facility] hospital lab) *cepheid single result test* Anterior Nasal Swab     Status: None   Collection Time: 07/25/21  9:25 PM   Specimen: Anterior Nasal Swab  Result Value Ref Range   SARS Coronavirus 2 by RT PCR NEGATIVE NEGATIVE    Comment: (NOTE) SARS-CoV-2 target nucleic acids are NOT DETECTED.  The SARS-CoV-2 RNA is generally detectable in upper and lower respiratory specimens during the acute phase of infection. The lowest concentration of SARS-CoV-2 viral copies this assay can detect is 250 copies / mL. A negative result does not preclude SARS-CoV-2 infection and should not be used as the sole basis for treatment or other patient  management decisions.  A negative result may occur with improper specimen collection / handling, submission of specimen other than nasopharyngeal swab, presence of viral mutation(s) within the areas targeted by this assay, and inadequate number of viral copies (<250 copies / mL). A negative result must be combined with clinical observations, patient history, and epidemiological information.  Fact Sheet for Patients:   https://www.patel.info/  Fact Sheet for Healthcare Providers: https://hall.com/  This test is not yet approved or  cleared by the [place] FDA and has been authorized for detection and/or diagnosis of SARS-CoV-2 by FDA under an Emergency Use Authorization (EUA).  This EUA will remain in effect (meaning this test can be used) for the duration of the COVID-19 declaration under Section 564(b)(1) of the Act, 21 U.S.C. section 360bbb-3(b)(1), unless the authorization is terminated or revoked sooner.  Performed at [facility], [address] [place], [place] [address]     No current facility-administered medications for this encounter.   Current Outpatient Medications  Medication Sig Dispense Refill   benzonatate (TESSALON PERLES) 100 MG capsule Take 1 capsule (100 mg total) by mouth 3 (three) times daily as needed for cough. 30 capsule 0   brompheniramine-pseudoephedrine-DM 30-2-10 MG/5ML syrup Take 10 mLs by mouth 4 (four) times daily as needed. 200 mL 0   ondansetron (ZOFRAN-ODT) 4 MG disintegrating tablet Take 1 tablet (4 mg total) by mouth every 8 (eight) hours as needed for nausea or vomiting. 20 tablet 0    Musculoskeletal: Strength & Muscle Tone: within normal limits Gait & Station: normal Patient leans: N/A Psychiatric Specialty Exam:  Presentation  General Appearance: Appropriate for Environment  Eye Contact:Good  Speech:Clear and Coherent  Speech Volume:Normal  Handedness:Right   Mood  and Affect  Mood:Depressed  Affect:Blunt; Congruent   Thought Process  Thought Processes:Coherent  Descriptions of Associations:Intact  Orientation:Full (Time,  Place and Person)  Thought Content:Logical  History of Schizophrenia/Schizoaffective disorder:No data recorded Duration of Psychotic Symptoms:No data recorded Hallucinations:Hallucinations: None  Ideas of Reference:None  Suicidal Thoughts:Suicidal Thoughts: No  Homicidal Thoughts:Homicidal Thoughts: No   Sensorium  Memory:Immediate Good; Recent Good; Remote Good  Judgment:Fair  Insight:Fair   Executive Functions  Concentration:Good  Attention Span:Good  Recall:Good  Fund of Knowledge:Good  Language:Good   Psychomotor Activity  Psychomotor Activity:Psychomotor Activity: Normal   Assets  Assets:Communication Skills; Desire for Improvement   Sleep  Sleep:Sleep: Good Number of Hours of Sleep: 8   Physical Exam: Physical Exam Vitals and nursing note reviewed.  Constitutional:      Appearance: Normal appearance.  HENT:     Head: Normocephalic and atraumatic.     Right Ear: External ear normal.     Left Ear: External ear normal.     Nose: Nose normal.  Cardiovascular:     Rate and Rhythm: Bradycardia present.  Pulmonary:     Effort: Pulmonary effort is normal.  Musculoskeletal:        General: Normal range of motion.     Cervical back: Normal range of motion and neck supple.  Neurological:     General: No focal deficit present.     Mental Status: He is alert and oriented to person, place, and time.  Psychiatric:        Attention and Perception: Attention and perception normal.        Mood and Affect: Mood is depressed.        Speech: Speech normal.        Behavior: Behavior normal. Behavior is cooperative.        Thought Content: Thought content normal.        Cognition and Memory: Cognition and memory normal.        Judgment: Judgment is impulsive and inappropriate.    Review  of Systems  Psychiatric/Behavioral:  Positive for depression and substance abuse.    Blood pressure 114/80, pulse (!) 57, temperature 97.7 F (36.5 C), temperature source Oral, resp. rate 18, height 5\' 10"  (1.778 m), weight 89.8 kg, SpO2 98 %. Body mass index is 28.41 kg/m.  Treatment Plan Summary: Plan Patient does meet criteria for psychiatric inpatient admission  Disposition: Recommend psychiatric Inpatient admission when medically cleared. Supportive therapy provided about ongoing stressors.  , NP 07/26/2021 2:31 AM

## 2021-07-26 NOTE — ED Notes (Signed)
Unlocked bathroom door to allow patient to shower.    Pt was given hygiene items as well as:  I clean top, 1 clean bottom, with 1 pair of disposable underwear.  Pt changed out into clean clothing.  Staff disposed of all shower supplies. Shower room cleaned and secured for next use.

## 2021-07-26 NOTE — Tx Team (Signed)
Initial Treatment Plan 07/26/2021 4:30 PM Nathan Pena [id number]    PATIENT STRESSORS: Financial difficulties   Substance abuse     PATIENT STRENGTHS: Supportive family/friends    PATIENT IDENTIFIED PROBLEMS: Anxiety, depression                     DISCHARGE CRITERIA:  Improved stabilization in mood, thinking, and/or behavior  PRELIMINARY DISCHARGE PLAN: Return to previous living arrangement  PATIENT/FAMILY INVOLVEMENT: This treatment plan has been presented to and reviewed with the patient, Nathan Pena. The patient and family have been given the opportunity to ask questions and make suggestions.  Hyman Hopes, RN 07/26/2021, 4:30 PM

## 2021-07-26 NOTE — Progress Notes (Signed)
Patient ID: Nathan Pena, male   DOB: [birth year], 31 y.o.   MRN: [id number] Admission note: Patient is a 31 year old male, presents IVC per report of major depressive disorder. Patient is alert and oriented to unit. Patients affect is anxious. Patient is cooperative during assessment questions. Patient states the reason for hospital admission is because "I tore up my moms camper and went [place] then [place]" stating "I had no idea what to do." Patient endorses anxiety 4/10 caused by being on the unit.  Patient does explain that he is currently living in his car with his vehicle with his fiance and that prior to coming to the hospital he has not eaten for 2 days because he was unable to afford a meal. Patient currently denies SI/HI/AVH. Unit policies explained and verbalized understanding. Q15 minute checks maintained and will continue to monitor.

## 2021-07-26 NOTE — BH Assessment (Signed)
Patient is to be admitted to [facility] by Psychiatric Nurse Practitioner Nanine Means.  Attending Physician will be Dr.  Toni Amend .   Patient has been assigned to room 303, by [facility] Charge Nurse Estill Bakes.   Intake Paper Work has been signed and placed on patient chart.   ER staff is aware of the admission: Fleet Contras, ER Secretary   Dr. Lenard Lance, ER MD  Bella Kennedy, Patient's Nurse  Fleet Contras, Patient Access.

## 2021-07-26 NOTE — Group Note (Addendum)
[facility] LCSW Group Therapy Note   Group Date: 07/26/2021 Start Time: 1300 End Time: 1400  Type of Therapy and Topic:  Group Therapy:  Feelings around Relapse and Recovery  Participation Level:  None    Description of Group:    Patients in this group will discuss emotions they experience before and after a relapse. They will process how experiencing these feelings, or avoidance of experiencing them, relates to having a relapse. Facilitator will guide patients to explore emotions they have related to recovery. Patients will be encouraged to process which emotions are more powerful. They will be guided to discuss the emotional reaction significant others in their lives may have to patients' relapse or recovery. Patients will be assisted in exploring ways to respond to the emotions of others without this contributing to a relapse.  Therapeutic Goals: Patient will identify two or more emotions that lead to relapse for them:  Patient will identify two emotions that result when they relapse:  Patient will identify two emotions related to recovery:  Patient will demonstrate ability to communicate their needs through discussion and/or role plays.   Summary of Patient Progress: Group not held due to acuity on the unit.   Therapeutic Modalities:   Cognitive Behavioral Therapy Solution-Focused Therapy Assertiveness Training Relapse Prevention Therapy   Asjah Rauda R Raeanna Soberanes, LCSW 

## 2021-07-26 NOTE — ED Notes (Signed)
Used phone during phone hours. 

## 2021-07-26 NOTE — ED Notes (Signed)
Hospital meal provided.  100% consumed, pt tolerated w/o complaints.  Waste discarded appropriately.   

## 2021-07-26 NOTE — ED Notes (Signed)
Report to Megan, RN.

## 2021-07-26 NOTE — ED Notes (Signed)
Breakfast given. Shower offered.

## 2021-07-26 NOTE — BH Assessment (Signed)
Referral information for Psychiatric Hospitalization faxed to:  [facility] ([phone]-or- [phone]),   [facility] ([phone]),  [facility] ([phone] or [phone])  [facility] ([phone]).  [facility] ([phone])  

## 2021-07-26 NOTE — ED Notes (Signed)
Isaiah Blakes wants patient to call her at his cell phone number--[phone]

## 2021-07-27 ENCOUNTER — Encounter: Payer: Self-pay | Admitting: Psychiatry

## 2021-07-27 DIAGNOSIS — F332 Major depressive disorder, recurrent severe without psychotic features: Secondary | ICD-10-CM

## 2021-07-27 MED ORDER — LORAZEPAM 1 MG PO TABS
1.0000 mg | ORAL_TABLET | ORAL | Status: DC | PRN
Start: 2021-07-27 — End: 2021-07-29
  Administered 2021-07-27: 1 mg via ORAL
  Filled 2021-07-27: qty 1

## 2021-07-27 MED ORDER — TRAZODONE HCL 100 MG PO TABS
100.0000 mg | ORAL_TABLET | Freq: Every day | ORAL | Status: DC
Start: 2021-07-27 — End: 2021-07-29
  Administered 2021-07-27: 100 mg via ORAL
  Filled 2021-07-27: qty 1

## 2021-07-27 MED ORDER — OLANZAPINE 5 MG PO TABS: 5.0000 mg | ORAL_TABLET | Freq: Four times a day (QID) | ORAL | Status: DC | PRN

## 2021-07-27 MED ORDER — FLUOXETINE HCL 20 MG PO CAPS
20.0000 mg | ORAL_CAPSULE | Freq: Every day | ORAL | Status: DC
  Administered 2021-07-28 – 2021-07-29 (×2): 20 mg via ORAL
  Filled 2021-07-27 (×2): qty 1

## 2021-07-27 MED ORDER — LORAZEPAM 1 MG PO TABS: 1.0000 mg | ORAL_TABLET | Freq: Once | ORAL | Status: DC

## 2021-07-27 MED ORDER — RISPERIDONE 1 MG PO TABS
0.5000 mg | ORAL_TABLET | ORAL | Status: DC
  Administered 2021-07-28 – 2021-07-29 (×3): 0.5 mg via ORAL
  Filled 2021-07-27 (×3): qty 1

## 2021-07-27 NOTE — H&P (Signed)
Psychiatric Admission Assessment Adult  Patient Identification: Nathan Pena MRN:  [id number] Date of Evaluation:  07/27/2021 Chief Complaint:  Major depressive disorder, recurrent severe without psychotic features (HCC) [F33.2] Principal Diagnosis: Major depressive disorder, recurrent severe without psychotic features (HCC) Diagnosis:  Principal Problem:   Major depressive disorder, recurrent severe without psychotic features (HCC)  History of Present Illness: Nathan Pena is a 31 y.o. male patient presented to [facility] ED via law enforcement under involuntary commitment status (IVC) from [facility]. Per the ED triage nurses note, Pt to ED under IVC from [facility]. IVC paper works that [place]. Pt states stressors from no job, living situation and family. Papers state pt had a machete and axe. 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 Pt reports he is homeless. Paperwork aslo reports he was planning suicide by cop but was stopped by family. Pt states I just "snapped". Pt reports he is not med compliant. Pt left wrist covered with bandage.   The patient stated that he woke up this morning and was tired of his life situations. He voiced that he has three children; they live in his mom's home with her and their kids. He shared that his mom does not like his fiancee, and she put them out of her home, but the kids were allowed to stay with her. The patient stated he did not have money to buy gas for work. The patient minimized his substance use. He shared that he had used meth six days ago. The patient states that his fiancee is living in their truck while he is in the hospital.   This provider saw The patient face-to-face; the chart was reviewed, and consulted with Dr. Derrill Kay on 07/25/2021 due to the patient's care. It was discussed with the EDP that the patient does meet the criteria to be admitted to the psychiatric inpatient unit.  On evaluation, the  patient is alert and oriented x 4, calm, cooperative, and mood-congruent with affect. The patient does not appear to be responding to internal or external stimuli. Neither is the patient presenting with any delusional thinking. The patient denies auditory or visual hallucinations. The patient denies any suicidal, homicidal, or self-harm ideations. The patient is not presenting with any psychotic or paranoid behaviors. During an encounter with the patient, he could answer questions appropriately.  Associated Signs/Symptoms: Depression Symptoms:  depressed mood, anhedonia, insomnia, Duration of Depression Symptoms: Greater than two weeks  (Hypo) Manic Symptoms:  None Anxiety Symptoms:  Excessive Worry, Social Anxiety, Psychotic Symptoms:  Paranoia, PTSD Symptoms: NA Total Time spent with patient: 1 hour  Past Psychiatric History: Previous psychiatric admission on 10/01/2007  Is the patient at risk to self? Yes.    Has the patient been a risk to self in the past 6 months? Yes.    Has the patient been a risk to self within the distant past? Yes.    Is the patient a risk to others? No.  Has the patient been a risk to others in the past 6 months? No.  Has the patient been a risk to others within the distant past? No.   Prior Inpatient Therapy:   Prior Outpatient Therapy:    Alcohol Screening:   Substance Abuse History in the last 12 months:  Yes.   Consequences of Substance Abuse: NA Previous Psychotropic Medications: Yes  Psychological Evaluations: Yes  Past Medical History:  Past Medical History:  Diagnosis Date   Asthma    History reviewed. No pertinent surgical history. Family History: History reviewed. No pertinent family history. Family Psychiatric  History: Unremarkable Tobacco Screening:   Social History:  Social History   Substance and Sexual Activity  Alcohol Use Yes   Alcohol/week: 2.0 standard drinks of alcohol   Types: 2 Cans of beer per week   Comment: reports  occasional alcohol use     Social History   Substance and Sexual Activity  Drug Use Yes   Types: Methamphetamines, Marijuana   Comment: reports using meth on a roughly a biweely basis    Additional Social History: Marital status: Long term relationship Long term relationship, how long?: Pt reported that he is engaged, 15 years What types of issues is patient dealing with in the relationship?: Pt and fiance were recently put out of pt's mother's house. Are you sexually active?: Yes What is your sexual orientation?: heterosexual Does patient have children?: Yes How many children?: 3 How is patient's relationship with their children?: Pt's children are with pt's mother and the pt and the their mother do not have stable housing.                         Allergies:  No Known Allergies Lab Results:  Results for orders placed or performed during the hospital encounter of 07/25/21 (from the past 48 hour(s))  Comprehensive metabolic panel     Status: Abnormal   Collection Time: 07/25/21  6:42 PM  Result Value Ref Range   Sodium 137 135 - 145 mmol/L   Potassium 3.2 (L) 3.5 - 5.1 mmol/L   Chloride 104 98 - 111 mmol/L   CO2 25 22 - 32 mmol/L   Glucose, Bld 105 (H) 70 - 99 mg/dL    Comment: Glucose reference range applies only to samples taken after fasting for at least 8 hours.   BUN 15 6 - 20 mg/dL   Creatinine, Ser 0.73 0.61 - 1.24 mg/dL   Calcium 9.0 8.9 - 71.0 mg/dL   Total Protein 7.4 6.5 - 8.1 g/dL   Albumin 4.2 3.5 - 5.0 g/dL   AST 26 15 - 41 U/L   ALT 32 0 - 44 U/L   Alkaline Phosphatase 69 38 - 126 U/L   Total Bilirubin 0.9 0.3 - 1.2 mg/dL   GFR, Estimated >62 >69 mL/min    Comment: (NOTE) Calculated using the CKD-EPI Creatinine Equation (2021)    Anion gap 8 5 - 15    Comment: Performed at [facility], [address] [place], [place] [address]  Ethanol     Status: None   Collection Time: 07/25/21  6:42 PM  Result Value Ref Range   Alcohol, Ethyl  (B) <10 <10 mg/dL    Comment: (NOTE) Lowest detectable limit for serum alcohol is 10 mg/dL.  For medical purposes only. Performed at [facility], [address] [place], [place] [address]   Salicylate level     Status: Abnormal   Collection Time: 07/25/21  6:42 PM  Result Value Ref Range   Salicylate Lvl <7.0 (L) 7.0 - 30.0 mg/dL    Comment: Performed at [facility], [address] [place], [place] [address]  Acetaminophen level     Status: Abnormal   Collection Time: 07/25/21  6:42 PM  Result Value Ref Range   Acetaminophen (Tylenol), Serum <10 (L) 10 - 30 ug/mL    Comment: (NOTE) Therapeutic concentrations vary  significantly. A range of 10-30 ug/mL  may be an effective concentration for many patients. However, some  are best treated at concentrations outside of this range. Acetaminophen concentrations >150 ug/mL at 4 hours after ingestion  and >50 ug/mL at 12 hours after ingestion are often associated with  toxic reactions.  Performed at [facility], [address] [place], [place] [address]   cbc     Status: None   Collection Time: 07/25/21  6:42 PM  Result Value Ref Range   WBC 9.0 4.0 - 10.5 K/uL   RBC 5.39 4.22 - 5.81 MIL/uL   Hemoglobin 15.6 13.0 - 17.0 g/dL   HCT 60.446.4 54.039.0 - 98.152.0 %   MCV 86.1 80.0 - 100.0 fL   MCH 28.9 26.0 - 34.0 pg   MCHC 33.6 30.0 - 36.0 g/dL   RDW 19.112.4 47.811.5 - 29.515.5 %   Platelets 312 150 - 400 K/uL   nRBC 0.0 0.0 - 0.2 %    Comment: Performed at [facility], [address] [place], [place] [address]  SARS Coronavirus 2 by RT PCR (hospital order, performed in [facility] hospital lab) *cepheid single result test* Anterior Nasal Swab     Status: None   Collection Time: 07/25/21  9:25 PM   Specimen: Anterior Nasal Swab  Result Value Ref Range   SARS Coronavirus 2 by RT PCR NEGATIVE NEGATIVE    Comment: (NOTE) SARS-CoV-2 target nucleic acids are NOT DETECTED.  The SARS-CoV-2 RNA is generally  detectable in upper and lower respiratory specimens during the acute phase of infection. The lowest concentration of SARS-CoV-2 viral copies this assay can detect is 250 copies / mL. A negative result does not preclude SARS-CoV-2 infection and should not be used as the sole basis for treatment or other patient management decisions.  A negative result may occur with improper specimen collection / handling, submission of specimen other than nasopharyngeal swab, presence of viral mutation(s) within the areas targeted by this assay, and inadequate number of viral copies (<250 copies / mL). A negative result must be combined with clinical observations, patient history, and epidemiological information.  Fact Sheet for Patients:   RoadLapTop.co.zahttps://www.fda.gov/media/158405/download  Fact Sheet for Healthcare Providers: http://kim-miller.com/https://www.fda.gov/media/158404/download  This test is not yet approved or  cleared by the [place] FDA and has been authorized for detection and/or diagnosis of SARS-CoV-2 by FDA under an Emergency Use Authorization (EUA).  This EUA will remain in effect (meaning this test can be used) for the duration of the COVID-19 declaration under Section 564(b)(1) of the Act, 21 U.S.C. section 360bbb-3(b)(1), unless the authorization is terminated or revoked sooner.  Performed at [facility], [address] [place], [place] [address]     Blood Alcohol level:  Lab Results  Component Value Date   [facility] <10 07/25/2021    Metabolic Disorder Labs:  No results found for: "HGBA1C", "MPG" No results found for: "PROLACTIN" No results found for: "CHOL", "TRIG", "HDL", "CHOLHDL", "VLDL", "LDLCALC"  Current Medications: Current Facility-Administered Medications  Medication Dose Route Frequency Provider Last Rate Last Admin   acetaminophen (TYLENOL) tablet 650 mg  650 mg Oral Q6H PRN Charm RingsLord, Jamison Y, NP       alum & mag hydroxide-simeth (MAALOX/MYLANTA) 200-200-20 MG/5ML  suspension 30 mL  30 mL Oral Q4H PRN Charm RingsLord, Jamison Y, NP       FLUoxetine (PROZAC) capsule 20 mg  20 mg Oral Daily Sarina IllHerrick, Raejean Swinford Edward, DO       LORazepam (ATIVAN)  tablet 1 mg  1 mg Oral Q4H PRN [name], DO       LORazepam (ATIVAN) tablet 1 mg  1 mg Oral Once [name], DO       magnesium hydroxide (MILK OF MAGNESIA) suspension 30 mL  30 mL Oral Daily PRN Charm Rings, NP       OLANZapine (ZYPREXA) tablet 5 mg  5 mg Oral Q6H PRN [name], DO       risperiDONE (RISPERDAL) tablet 0.5 mg  0.5 mg Oral BH-q8a4p [name], DO       traZODone (DESYREL) tablet 100 mg  100 mg Oral QHS [name], DO       PTA Medications: Medications Prior to Admission  Medication Sig Dispense Refill Last Dose   benzonatate (TESSALON PERLES) 100 MG capsule Take 1 capsule (100 mg total) by mouth 3 (three) times daily as needed for cough. (Patient not taking: Reported on 07/26/2021) 30 capsule 0    brompheniramine-pseudoephedrine-DM 30-2-10 MG/5ML syrup Take 10 mLs by mouth 4 (four) times daily as needed. (Patient not taking: Reported on 07/26/2021) 200 mL 0    ondansetron (ZOFRAN-ODT) 4 MG disintegrating tablet Take 1 tablet (4 mg total) by mouth every 8 (eight) hours as needed for nausea or vomiting. (Patient not taking: Reported on 07/26/2021) 20 tablet 0     Musculoskeletal: Strength & Muscle Tone: within normal limits Gait & Station: normal Patient leans: N/A            Psychiatric Specialty Exam:  Presentation  General Appearance: Appropriate for Environment  Eye Contact:Good  Speech:Clear and Coherent  Speech Volume:Normal  Handedness:Right   Mood and Affect  Mood:Depressed  Affect:Blunt; Congruent   Thought Process  Thought Processes:Coherent  Duration of Psychotic Symptoms: No data recorded Past Diagnosis of Schizophrenia or Psychoactive disorder: No  Descriptions of Associations:Intact  Orientation:Full  (Time, Place and Person)  Thought Content:Logical  Hallucinations:No data recorded Ideas of Reference:None  Suicidal Thoughts:No data recorded Homicidal Thoughts:No data recorded  Sensorium  Memory:Immediate Good; Recent Good; Remote Good  Judgment:Fair  Insight:Fair   Executive Functions  Concentration:Good  Attention Span:Good  Recall:Good  Fund of Knowledge:Good  Language:Good   Psychomotor Activity  Psychomotor Activity:No data recorded  Assets  Assets:Communication Skills; Desire for Improvement   Sleep  Sleep:No data recorded   Physical Exam: Physical Exam Vitals and nursing note reviewed.  Constitutional:      Appearance: Normal appearance. He is normal weight.  HENT:     Head: Normocephalic and atraumatic.     Nose: Nose normal.     Mouth/Throat:     Pharynx: Oropharynx is clear.  Eyes:     Extraocular Movements: Extraocular movements intact.     Pupils: Pupils are equal, round, and reactive to light.  Cardiovascular:     Rate and Rhythm: Normal rate and regular rhythm.     Pulses: Normal pulses.     Heart sounds: Normal heart sounds.  Pulmonary:     Effort: Pulmonary effort is normal.     Breath sounds: Normal breath sounds.  Abdominal:     General: Abdomen is flat. Bowel sounds are normal.     Palpations: Abdomen is soft.  Genitourinary:    Penis: Normal.      Testes: Normal.     Rectum: Normal.  Musculoskeletal:        General: Normal range of motion.     Cervical back: Normal range of motion and neck  supple.  Skin:    General: Skin is warm and dry.  Neurological:     General: No focal deficit present.     Mental Status: He is alert and oriented to person, place, and time.  Psychiatric:        Attention and Perception: Attention and perception normal.        Mood and Affect: Mood is depressed. Affect is flat.        Speech: Speech normal.        Behavior: Behavior normal. Behavior is cooperative.        Thought Content:  Thought content normal.        Cognition and Memory: Cognition and memory normal.        Judgment: Judgment is impulsive.    Review of Systems  Constitutional: Negative.   HENT: Negative.    Eyes: Negative.   Respiratory: Negative.    Cardiovascular: Negative.   Gastrointestinal: Negative.   Genitourinary: Negative.   Musculoskeletal: Negative.   Skin: Negative.   Neurological: Negative.   Endo/Heme/Allergies: Negative.   Psychiatric/Behavioral:  Positive for depression. The patient has insomnia.    Blood pressure 127/84, pulse (!) 53, temperature 98.3 F (36.8 C), temperature source Oral, resp. rate 18, height 5\' 10"  (1.778 m), weight 83.9 kg, SpO2 100 %. Body mass index is 26.54 kg/m.  Treatment Plan Summary: Daily contact with patient to assess and evaluate symptoms and progress in treatment, Medication management, and Plan start Ativan as needed due to his methamphetamine use.  Start Prozac 20 mg/day.  Start trazodone at bedtime and Risperdal 0.5 mg twice a day for depression, racing thoughts, and irritability.  Observation Level/Precautions:  15 minute checks  Laboratory:  CBC Chemistry Profile  Psychotherapy:    Medications:    Consultations:    Discharge Concerns:    Estimated LOS:  Other:     Physician Treatment Plan for Primary Diagnosis: Major depressive disorder, recurrent severe without psychotic features (HCC) Long Term Goal(s): Improvement in symptoms so as ready for discharge  Short Term Goals: Ability to identify changes in lifestyle to reduce recurrence of condition will improve, Ability to verbalize feelings will improve, Ability to disclose and discuss suicidal ideas, Ability to demonstrate self-control will improve, Ability to identify and develop effective coping behaviors will improve, Ability to maintain clinical measurements within normal limits will improve, Compliance with prescribed medications will improve, and Ability to identify triggers associated  with substance abuse/mental health issues will improve  Physician Treatment Plan for Secondary Diagnosis: Principal Problem:   Major depressive disorder, recurrent severe without psychotic features (HCC)   I certify that inpatient services furnished can reasonably be expected to improve the patient's condition.    , DO 7/8/202311:16 AM

## 2021-07-27 NOTE — Progress Notes (Signed)
No distress noted denies SI/HI/AVH, 15 minutes safety checks maintained.

## 2021-07-27 NOTE — Group Note (Signed)
[facility] LCSW Group Therapy Note   Group Date: 07/27/2021 Start Time: 1300 End Time: 1400  Type of Therapy and Topic:  Group Therapy:  Feelings around Relapse and Recovery  Participation Level:  Active   Description of Group:    Patients in this group will discuss emotions they experience before and after a relapse. They will process how experiencing these feelings, or avoidance of experiencing them, relates to having a relapse. Facilitator will guide patients to explore emotions they have related to recovery. Patients will be encouraged to process which emotions are more powerful. They will be guided to discuss the emotional reaction significant others in their lives may have to patients' relapse or recovery. Patients will be assisted in exploring ways to respond to the emotions of others without this contributing to a relapse.  Therapeutic Goals: Patient will identify two or more emotions that lead to relapse for them:  Patient will identify two emotions that result when they relapse:  Patient will identify two emotions related to recovery:  Patient will demonstrate ability to communicate their needs through discussion and/or role plays.   Summary of Patient Progress:  Patient was present for the entirety of the group session. Patient was an active listener and participated in the topic of discussion, provided helpful advice to others, and added nuance to topic of conversation. States he has difficulty with stress from work that causes him to relapse.    Therapeutic Modalities:   Cognitive Behavioral Therapy Solution-Focused Therapy Assertiveness Training Relapse Prevention Therapy   Corky Crafts, [place]

## 2021-07-27 NOTE — Plan of Care (Signed)
Pt rates depression 4/10, hopelessness 3/10 and anxiety 7/10. Pt denies SI, HI and AVH. Pt was educated on care plan and verbalizes understanding. Nathan Mayers RN Problem: Activity: Goal: Sleeping patterns will improve Outcome: Not Progressing

## 2021-07-27 NOTE — [facility] Suicide Risk Assessment (Signed)
[facility] Admission Suicide Risk Assessment   Nursing information obtained from:  Patient Demographic factors:  Male, Caucasian, Low socioeconomic status Current Mental Status:  NA Loss Factors:  Financial problems / change in socioeconomic status Historical Factors:  NA Risk Reduction Factors:  Employed, Positive social support  Total Time spent with patient: 1 hour Principal Problem: Major depressive disorder, recurrent severe without psychotic features (HCC) Diagnosis:  Principal Problem:   Major depressive disorder, recurrent severe without psychotic features (HCC)  Subjective Data: Pt to ED under IVC from [facility]. IVC paper works that [place]. Pt states stressors from no job, living situation and family. Papers state pt had a machete and axe. 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 Pt reports he is homeless. Paperwork aslo reports he was planning suicide by cop but was stopped by family. Pt states I just "snapped". Pt reports he is not med compliant. Pt left wrist covered with bandage.   Continued Clinical Symptoms:    The "Alcohol Use Disorders Identification Test", Guidelines for Use in Primary Care, Second Edition.  World Science writer [facility]). Score between 0-7:  no or low risk or alcohol related problems. Score between 8-15:  moderate risk of alcohol related problems. Score between 16-19:  high risk of alcohol related problems. Score 20 or above:  warrants further diagnostic evaluation for alcohol dependence and treatment.   CLINICAL FACTORS:   Depression:   Insomnia   Musculoskeletal: Strength & Muscle Tone: within normal limits Gait & Station: normal Patient leans: N/A  Psychiatric Specialty Exam:  Presentation  General Appearance: Appropriate for Environment  Eye Contact:Good  Speech:Clear and Coherent  Speech Volume:Normal  Handedness:Right   Mood and Affect  Mood:Depressed  Affect:Blunt;  Congruent   Thought Process  Thought Processes:Coherent  Descriptions of Associations:Intact  Orientation:Full (Time, Place and Person)  Thought Content:Logical  History of Schizophrenia/Schizoaffective disorder:No  Duration of Psychotic Symptoms:No data recorded Hallucinations:No data recorded Ideas of Reference:None  Suicidal Thoughts:No data recorded Homicidal Thoughts:No data recorded  Sensorium  Memory:Immediate Good; Recent Good; Remote Good  Judgment:Fair  Insight:Fair   Executive Functions  Concentration:Good  Attention Span:Good  Recall:Good  Fund of Knowledge:Good  Language:Good   Psychomotor Activity  Psychomotor Activity:No data recorded  Assets  Assets:Communication Skills; Desire for Improvement   Sleep  Sleep:No data recorded   Blood pressure 127/84, pulse (!) 53, temperature 98.3 F (36.8 C), temperature source Oral, resp. rate 18, height 5\' 10"  (1.778 m), weight 83.9 kg, SpO2 100 %. Body mass index is 26.54 kg/m.   COGNITIVE FEATURES THAT CONTRIBUTE TO RISK:  None    SUICIDE RISK:   Minimal: No identifiable suicidal ideation.  Patients presenting with no risk factors but with morbid ruminations; may be classified as minimal risk based on the severity of the depressive symptoms  PLAN OF CARE: See orders  I certify that inpatient services furnished can reasonably be expected to improve the patient's condition.   Azzam Mehra , DO 07/27/2021, 11:22 AM

## 2021-07-27 NOTE — Progress Notes (Signed)
Patient alert and oriented x 4, affect is bright and receptive to staff, he denies SI/HI/AVH no distress noted interacting with peers and staff appropriately. 15 minutes safety checks maintained will continue to monitor.

## 2021-07-27 NOTE — Progress Notes (Signed)
Pt has been withdrawn. His hygiene is poor. Pt has not been med compliant however calm. Pt has a good appetite. Pt is safe. Torrie Mayers RN

## 2021-07-27 NOTE — [facility] Counselor (Signed)
Adult Comprehensive Assessment  Patient ID: Nathan Pena, male   DOB: 09-01-1990, 31 y.o.   MRN: [id number]  Information Source:    Current Stressors:  Patient states their primary concerns and needs for treatment are:: During assessment, patient simply states "I am homeless, it is a never ending cycle." States he woke up after not having ate and without gas money and "flipped out." Used the last bit of gas he had to [place] where he [place] his wrists. Patient's mother called him and he told her what he had done; she called [facility] who IVC'd patient at hospital. Also endorses active methamphetamine use, though reports he was sober at time of suicde attempt. Patient states their goals for this hospitilization and ongoing recovery are:: States his goal for tretament is to "really had not planned on being here, just do my time and go home. Educational / Learning stressors: none reported Employment / Job issues: none reported Family Relationships: none reported Surveyor, quantity / Lack of resources (include bankruptcy): states "having difficulty paying for everything" Housing / Lack of housing: states his mother threw him out of the house due ther his partner not having a job Physical health (include injuries & life threatening diseases): none reported Social relationships: none reported Substance abuse: endorses active methamphetamine use. Bereavement / Loss: none reported  Living/Environment/Situation:  Living Arrangements: Spouse/significant other Living conditions (as described by patient or guardian): Patient lives in a camper with limited access to typical utilities depending on location Who else lives in the home?: Patient lives with partner How long has patient lived in current situation?: 2 weeks What is atmosphere in current home: Chaotic  Family History:  Marital status: Long term relationship Long term relationship, how long?: Pt reported that he is engaged, 15 years What types of  issues is patient dealing with in the relationship?: Pt and fiance were recently put out of pt's mother's house. Are you sexually active?: Yes What is your sexual orientation?: heterosexual Does patient have children?: Yes How many children?: 3 How is patient's relationship with their children?: Pt's children are with pt's mother and the pt and the their mother do not have stable housing.  Childhood History:  By whom was/is the patient raised?: Mother Additional childhood history information: patient's father passed at 74 y/o, reports having a good relationship with father prior to his death Description of patient's relationship with caregiver when they were a child: simply describes relationship as "good" Patient's description of current relationship with people who raised him/her: States his relationship with his mother has suffered as of recently due to interpersonal conflict around his partner How were you disciplined when you got in trouble as a child/adolescent?: states he was physically reprimanded Does patient have siblings?: Yes Number of Siblings: 2 Description of patient's current relationship with siblings: states he "don't talk" with his brothers Did patient suffer any verbal/emotional/physical/sexual abuse as a child?: No Did patient suffer from severe childhood neglect?: No Has patient ever been sexually abused/assaulted/raped as an adolescent or adult?: No Was the patient ever a victim of a crime or a disaster?: No Witnessed domestic violence?: No Has patient been affected by domestic violence as an adult?: No  Education:  Highest grade of school patient has completed: 10th grade Currently a student?: No Learning disability?: No  Employment/Work Situation:   Employment Situation: Employed Where is Patient Currently Employed?: [employer] How Long has Patient Been Employed?: 1 year Are You Satisfied With Your Job?: Yes 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?: No  Financial Resources:   Financial resources: Income from employment Does patient have a representative payee or guardian?: No  Alcohol/Substance Abuse:   Alcohol Level    Component Value Date/Time   ETH <10 07/25/2021 1842   Social History   Substance and Sexual Activity  Alcohol Use Yes   Alcohol/week: 2.0 standard drinks of alcohol   Types: 2 Cans of beer per week   Comment: reports occasional alcohol use   Social History   Substance and Sexual Activity  Drug Use Yes   Types: Methamphetamines, Marijuana   Comment: reports using meth on a roughly a biweely basis   If attempted suicide, did drugs/alcohol play a role in this?: No Alcohol/Substance Abuse Treatment Hx: Denies past history Has alcohol/substance abuse ever caused legal problems?: No  Social Support System:   Conservation officer, nature Support System: Fair Museum/gallery exhibitions officer System: Lists his partner as supportive of his mental health Type of faith/religion: none How does patient's faith help to cope with current illness?: n/a  Leisure/Recreation:   Do You Have Hobbies?: Yes Leisure and Hobbies: ride dirt bikes, 4 wheelers, and fish/hunt  Strengths/Needs:   Patient states these barriers may affect/interfere with their treatment: none reported Patient states these barriers may affect their return to the community: none reported Other important information patient would like considered in planning for their treatment: none reported  Discharge Plan:   Currently receiving community mental health services: No (patient has declined consent for CSW to make referrals for outpatient mental health  services.) Patient states they will know when they are safe and ready for discharge when: Patient belives he is ready for discharge at this time. 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 w/ no listed insurance admitted following suicide attempt. Reports suicidal ideation has incresed since his mother kicked him and his partner out of the house due to his partner not having a job. Suring assessment, patient simply states "I am homeless, it is a never ending cycle." States he woke up after not having ate and without gas money and "flipped out." Used the last bit of gas he had to drive to a creek where he cut his wrists. Patient's mother called him and he told her what he had done; she called [facility] who IVC'd patient at hospital. Also endorses active methamphetamine use, though reports he was sober at time of suicde attempt. States his goal for tretament is to "really had not planned on being here, just do my time and go home. Patient is not currently associated with any outpatient mental heatlh services; declines consent for CSW to make approrpiate referrals. Therapeutic recommendations include further crisis stabilization, medication management, group therapy, and case management.  Corky Crafts. 07/27/2021

## 2021-07-28 DIAGNOSIS — F332 Major depressive disorder, recurrent severe without psychotic features: Secondary | ICD-10-CM | POA: Diagnosis not present

## 2021-07-28 NOTE — Progress Notes (Signed)
[facility] MD Progress Note  07/28/2021 11:31 AM Nathan Pena  MRN:  [id number] Subjective: [name] was reluctant to take medication yesterday but he did end up taking them and states that he does feel better.  The laceration on his wrist is superficial.  Nurses report no issues with him.  He denies any side effects from his medications.  Principal Problem: Major depressive disorder, recurrent severe without psychotic features (HCC) Diagnosis: Principal Problem:   Major depressive disorder, recurrent severe without psychotic features (HCC)  Total Time spent with patient: 15 minutes  Past Psychiatric History: None  Past Medical History:  Past Medical History:  Diagnosis Date   Asthma    History reviewed. No pertinent surgical history. Family History: History reviewed. No pertinent family history. Family Psychiatric  History: Unremarkable Social History:  Social History   Substance and Sexual Activity  Alcohol Use Yes   Alcohol/week: 2.0 standard drinks of alcohol   Types: 2 Cans of beer per week   Comment: reports occasional alcohol use     Social History   Substance and Sexual Activity  Drug Use Yes   Types: Methamphetamines, Marijuana   Comment: reports using meth on a roughly a biweely basis    Social History   Socioeconomic History   Marital status: Single    Spouse name: Not on file   Number of children: Not on file   Years of education: Not on file   Highest education level: Not on file  Occupational History   Not on file  Tobacco Use   Smoking status: Every Day    Packs/day: 0.50    Types: Cigarettes   Smokeless tobacco: Never  Vaping Use   Vaping Use: Never used  Substance and Sexual Activity   Alcohol use: Yes    Alcohol/week: 2.0 standard drinks of alcohol    Types: 2 Cans of beer per week    Comment: reports occasional alcohol use   Drug use: Yes    Types: Methamphetamines, Marijuana    Comment: reports using meth on a roughly a biweely basis   Sexual  activity: Not on file  Other Topics Concern   Not on file  Social History Narrative   Not on file   Social Determinants of Health   Financial Resource Strain: Not on file  Food Insecurity: Not on file  Transportation Needs: Not on file  Physical Activity: Not on file  Stress: Not on file  Social Connections: Not on file   Additional Social History:                         Sleep: Good  Appetite:  Good  Current Medications: Current Facility-Administered Medications  Medication Dose Route Frequency Provider Last Rate Last Admin   acetaminophen (TYLENOL) tablet 650 mg  650 mg Oral Q6H PRN Charm Rings, NP       alum & mag hydroxide-simeth (MAALOX/MYLANTA) 200-200-20 MG/5ML suspension 30 mL  30 mL Oral Q4H PRN Charm Rings, NP       FLUoxetine (PROZAC) capsule 20 mg  20 mg Oral Daily [name], DO   20 mg at 07/28/21 7253   LORazepam (ATIVAN) tablet 1 mg  1 mg Oral Q4H PRN [name], DO   1 mg at 07/27/21 2131   LORazepam (ATIVAN) tablet 1 mg  1 mg Oral Once [name], DO       magnesium hydroxide (MILK OF MAGNESIA) suspension 30 mL  30 mL Oral Daily PRN Charm Rings, NP       OLANZapine [facility]) tablet 5 mg  5 mg Oral Q6H PRN [name], DO       risperiDONE (RISPERDAL) tablet 0.5 mg  0.5 mg Oral BH-[id number] [name], DO   0.5 mg at 07/28/21 7322   traZODone (DESYREL) tablet 100 mg  100 mg Oral QHS [name], DO   100 mg at 07/27/21 2131    Lab Results: No results found for this or any previous visit (from the past 48 hour(s)).  Blood Alcohol level:  Lab Results  Component Value Date   ETH <10 07/25/2021    Metabolic Disorder Labs: No results found for: "HGBA1C", "MPG" No results found for: "PROLACTIN" No results found for: "CHOL", "TRIG", "HDL", "CHOLHDL", "VLDL", "LDLCALC"  Physical Findings: AIMS:  , ,  ,  ,    CIWA:    COWS:     Musculoskeletal: Strength & Muscle  Tone: within normal limits Gait & Station: normal Patient leans: N/A  Psychiatric Specialty Exam:  Presentation  General Appearance: Appropriate for Environment  Eye Contact:Good  Speech:Clear and Coherent  Speech Volume:Normal  Handedness:Right   Mood and Affect  Mood:Depressed  Affect:Blunt; Congruent   Thought Process  Thought Processes:Coherent  Descriptions of Associations:Intact  Orientation:Full (Time, Place and Person)  Thought Content:Logical  History of Schizophrenia/Schizoaffective disorder:No  Duration of Psychotic Symptoms:No data recorded Hallucinations:No data recorded Ideas of Reference:None  Suicidal Thoughts:No data recorded Homicidal Thoughts:No data recorded  Sensorium  Memory:Immediate Good; Recent Good; Remote Good  Judgment:Fair  Insight:Fair   Executive Functions  Concentration:Good  Attention Span:Good  Recall:Good  Fund of Knowledge:Good  Language:Good   Psychomotor Activity  Psychomotor Activity:No data recorded  Assets  Assets:Communication Skills; Desire for Improvement   Sleep  Sleep:No data recorded   Physical Exam: Physical Exam Vitals and nursing note reviewed.  Constitutional:      Appearance: Normal appearance. He is normal weight.  Neurological:     General: No focal deficit present.     Mental Status: He is alert and oriented to person, place, and time.  Psychiatric:        Mood and Affect: Mood normal.        Behavior: Behavior normal.    Review of Systems  Constitutional: Negative.   HENT: Negative.    Eyes: Negative.   Respiratory: Negative.    Cardiovascular: Negative.   Gastrointestinal: Negative.   Genitourinary: Negative.   Musculoskeletal: Negative.   Skin: Negative.   Neurological: Negative.   Endo/Heme/Allergies: Negative.   Psychiatric/Behavioral: Negative.     Blood pressure (!) 116/98, pulse 89, temperature 98.7 F (37.1 C), temperature source Oral, resp. rate 18,  height 5\' 10"  (1.778 m), weight 83.9 kg, SpO2 100 %. Body mass index is 26.54 kg/m.   Treatment Plan Summary: Daily contact with patient to assess and evaluate symptoms and progress in treatment, Medication management, and Plan continue current medications.  , DO 07/28/2021, 11:31 AM

## 2021-07-28 NOTE — [facility] Suicide Risk Assessment (Signed)
[facility] INPATIENT:  Family/Significant Other Suicide Prevention Education  Suicide Prevention Education:  Education Completed; Nathan Pena, partner, [phone],  (name of family member/significant other) has been identified by the patient as the family member/significant other with whom the patient will be residing, and identified as the person(s) who will aid the patient in the event of a mental health crisis (suicidal ideations/suicide attempt).  With written consent from the patient, the family member/significant other has been provided the following suicide prevention education, prior to the and/or following the discharge of the patient.  In addition to the precautions below, patient's support agrees to monitor patient for safety, ensure treatment compliance, and alert emergency services should patient require such services. States she "does not have any concerns of him harming himself again." States his "anger gets the best of him." Though he states "sounds to be doing better today."   The suicide prevention education provided includes the following: Suicide risk factors Suicide prevention and interventions National Suicide Hotline telephone number [facility] assessment telephone number [facility] Emergency Assistance 911 [facility] and/or Residential Mobile Crisis Unit telephone number  Request made of family/significant other to: Remove weapons (e.g., guns, rifles, knives), all items previously/currently identified as safety concern.   Remove drugs/medications (over-the-counter, prescriptions, illicit drugs), all items previously/currently identified as a safety concern.  The family member/significant other verbalizes understanding of the suicide prevention education information provided.  The family member/significant other agrees to remove the items of safety concern listed above.  Nathan Pena 07/28/2021, 4:04 PM

## 2021-07-28 NOTE — Progress Notes (Signed)
Patient pleasant and cooperative. Sleeping thus far most of shift. In no distress. Trazodone not given due to patient asleep.  Encouragement and support provided. Safety checks maintained. Medications given as prescribed. Pt receptive and remains safe on unit with q 15 min checks.

## 2021-07-28 NOTE — Plan of Care (Signed)
  Problem: Safety: Goal: Ability to remain free from injury will improve Outcome: Progressing   Problem: Education: Goal: Knowledge of [facility] Education information/materials will improve Outcome: Progressing Goal: Emotional status will improve Outcome: Progressing Goal: Mental status will improve Outcome: Progressing Goal: Verbalization of understanding the information provided will improve Outcome: Progressing   

## 2021-07-28 NOTE — Progress Notes (Signed)
Patient alert and oriented x 4 denies SI/HI/AVH, thoughts are organized and coherent denies SI/HI/AVH interacting appropriately with peers and staff. 15 minutes safety checks maintained will continue to monitor.

## 2021-07-28 NOTE — Plan of Care (Signed)
D- Patient alert and oriented. Patient presents in a pleasant mood on assessment reporting that he slept "good" last night and had no complaints to voice to this Clinical research associate. Patient denies any signs/symptoms of depression/anxiety, however, he did endorse hopelessness on his self-inventory. Patient did not go into detail as to why he reported this. Patient also denies SI, HI, AVH, and pain at this time. Patient's goal for today is to "go home".  A- Scheduled medications administered to patient, per MD orders. Support and encouragement provided.  Routine safety checks conducted every 15 minutes.  Patient informed to notify staff with problems or concerns.  R- No adverse drug reactions noted. Patient contracts for safety at this time. Patient compliant with medications and treatment plan. Patient receptive, calm, and cooperative. Patient interacts well with others on the unit. Patient remains safe at this time.  Problem: Education: Goal: Knowledge of General Education information will improve Description: Including pain rating scale, medication(s)/side effects and non-pharmacologic comfort measures Outcome: Progressing   Problem: Health Behavior/Discharge Planning: Goal: Ability to manage health-related needs will improve Outcome: Progressing   Problem: Clinical Measurements: Goal: Ability to maintain clinical measurements within normal limits will improve Outcome: Progressing Goal: Will remain free from infection Outcome: Progressing Goal: Diagnostic test results will improve Outcome: Progressing Goal: Respiratory complications will improve Outcome: Progressing Goal: Cardiovascular complication will be avoided Outcome: Progressing   Problem: Activity: Goal: Risk for activity intolerance will decrease Outcome: Progressing   Problem: Nutrition: Goal: Adequate nutrition will be maintained Outcome: Progressing   Problem: Coping: Goal: Level of anxiety will decrease Outcome: Progressing    Problem: Elimination: Goal: Will not experience complications related to bowel motility Outcome: Progressing Goal: Will not experience complications related to urinary retention Outcome: Progressing   Problem: Pain Managment: Goal: General experience of comfort will improve Outcome: Progressing   Problem: Safety: Goal: Ability to remain free from injury will improve Outcome: Progressing   Problem: Skin Integrity: Goal: Risk for impaired skin integrity will decrease Outcome: Progressing   Problem: Education: Goal: Knowledge of [facility] Education information/materials will improve Outcome: Progressing Goal: Emotional status will improve Outcome: Progressing Goal: Mental status will improve Outcome: Progressing Goal: Verbalization of understanding the information provided will improve Outcome: Progressing   Problem: Activity: Goal: Interest or engagement in activities will improve Outcome: Progressing Goal: Sleeping patterns will improve Outcome: Progressing   Problem: Coping: Goal: Ability to verbalize frustrations and anger appropriately will improve Outcome: Progressing Goal: Ability to demonstrate self-control will improve Outcome: Progressing   Problem: Health Behavior/Discharge Planning: Goal: Identification of resources available to assist in meeting health care needs will improve Outcome: Progressing Goal: Compliance with treatment plan for underlying cause of condition will improve Outcome: Progressing   Problem: Physical Regulation: Goal: Ability to maintain clinical measurements within normal limits will improve Outcome: Progressing   Problem: Safety: Goal: Periods of time without injury will increase Outcome: Progressing   Problem: Education: Goal: Utilization of techniques to improve thought processes will improve Outcome: Progressing Goal: Knowledge of the prescribed therapeutic regimen will improve Outcome: Progressing   Problem:  Activity: Goal: Interest or engagement in leisure activities will improve Outcome: Progressing Goal: Imbalance in normal sleep/wake cycle will improve Outcome: Progressing   Problem: Coping: Goal: Coping ability will improve Outcome: Progressing Goal: Will verbalize feelings Outcome: Progressing   Problem: Health Behavior/Discharge Planning: Goal: Ability to make decisions will improve Outcome: Progressing Goal: Compliance with therapeutic regimen will improve Outcome: Progressing   Problem: Role Relationship: Goal: Will demonstrate  positive changes in social behaviors and relationships Outcome: Progressing   Problem: Safety: Goal: Ability to disclose and discuss suicidal ideas will improve Outcome: Progressing Goal: Ability to identify and utilize support systems that promote safety will improve Outcome: Progressing   Problem: Self-Concept: Goal: Will verbalize positive feelings about self Outcome: Progressing Goal: Level of anxiety will decrease Outcome: Progressing

## 2021-07-29 ENCOUNTER — Other Ambulatory Visit: Payer: Self-pay

## 2021-07-29 DIAGNOSIS — F332 Major depressive disorder, recurrent severe without psychotic features: Secondary | ICD-10-CM | POA: Diagnosis not present

## 2021-07-29 MED ORDER — FLUOXETINE HCL 20 MG PO CAPS
20.0000 mg | ORAL_CAPSULE | Freq: Every day | ORAL | 0 refills | Status: AC
Start: 2021-07-30 — End: ?
  Filled 2021-07-29: qty 10, 10d supply, fill #0

## 2021-07-29 MED ORDER — FLUOXETINE HCL 20 MG PO CAPS
20.0000 mg | ORAL_CAPSULE | Freq: Every day | ORAL | 1 refills | Status: DC
Start: 2021-07-30 — End: 2021-07-29

## 2021-07-29 MED ORDER — TRAZODONE HCL 100 MG PO TABS: 100.0000 mg | ORAL_TABLET | Freq: Every evening | ORAL | 1 refills | Status: DC | PRN

## 2021-07-29 MED ORDER — TRAZODONE HCL 100 MG PO TABS
100.0000 mg | ORAL_TABLET | Freq: Every evening | ORAL | Status: DC | PRN
Start: 2021-07-29 — End: 2021-07-29

## 2021-07-29 MED ORDER — TRAZODONE HCL 100 MG PO TABS
100.0000 mg | ORAL_TABLET | Freq: Every evening | ORAL | 0 refills | Status: AC | PRN
  Filled 2021-07-29: qty 10, 10d supply, fill #0

## 2021-07-29 NOTE — Discharge Summary (Signed)
Physician Discharge Summary Note  Patient:  Nathan Pena is an 31 y.o., male MRN:  [id number] DOB:  1991/01/20 Patient phone:  [phone] (home)  Patient address:   [address] [place] [address],  Total Time spent with patient: 30 minutes  Date of Admission:  07/26/2021 Date of Discharge: 07/29/2021  Reason for Admission: Patient was admitted after cutting himself on the wrist in an episode of agitation and making suicidal threats  Principal Problem: Major depressive disorder, recurrent severe without psychotic features [facility]) Discharge Diagnoses: Principal Problem:   Major depressive disorder, recurrent severe without psychotic features (HCC)   Past Psychiatric History: Past history of previous mood symptoms and substance abuse with only minimal exposure to outpatient treatment  Past Medical History:  Past Medical History:  Diagnosis Date   Asthma    History reviewed. No pertinent surgical history. Family History: History reviewed. No pertinent family history. Family Psychiatric  History: See above Social History:  Social History   Substance and Sexual Activity  Alcohol Use Yes   Alcohol/week: 2.0 standard drinks of alcohol   Types: 2 Cans of beer per week   Comment: reports occasional alcohol use     Social History   Substance and Sexual Activity  Drug Use Yes   Types: Methamphetamines, Marijuana   Comment: reports using meth on a roughly a biweely basis    Social History   Socioeconomic History   Marital status: Single    Spouse name: Not on file   Number of children: Not on file   Years of education: Not on file   Highest education level: Not on file  Occupational History   Not on file  Tobacco Use   Smoking status: Every Day    Packs/day: 0.50    Types: Cigarettes   Smokeless tobacco: Never  Vaping Use   Vaping Use: Never used  Substance and Sexual Activity   Alcohol use: Yes    Alcohol/week: 2.0 standard drinks of alcohol     Types: 2 Cans of beer per week    Comment: reports occasional alcohol use   Drug use: Yes    Types: Methamphetamines, Marijuana    Comment: reports using meth on a roughly a biweely basis   Sexual activity: Not on file  Other Topics Concern   Not on file  Social History Narrative   Not on file   Social Determinants of Health   Financial Resource Strain: Not on file  Food Insecurity: Not on file  Transportation Needs: Not on file  Physical Activity: Not on file  Stress: Not on file  Social Connections: Not on file    Hospital Course: Patient admitted to psychiatric unit.  15-minute checks continued.  Patient was calm and cooperative with treatment by the time he arrived on the ward.  Patient was started on medication for depression by psychiatric team.  He was monitored for any signs of withdrawal.  No signs of seizures withdrawal or delirium tremens.  Patient participated appropriately and attended treatment team today.  He has consistently denied having any suicidal ideation.  He does admit to the use of methamphetamine but tends to minimize the severity of it.  He has tolerated medication well and at the time of discharge is completely denying any suicidal thought intent or plan.  He states that he intends to go back to stay with his mother at this point and agrees to follow-up at [facility].  At the time of discharge appears to  be motivated for follow-up treatment with no sign of acute dangerousness.  Discontinued antipsychotics as there did not appear to be any acute indication for them.  Continued antidepressant medicine.  Physical Findings: AIMS: Facial and Oral Movements Muscles of Facial Expression: None, normal Lips and Perioral Area: None, normal Jaw: None, normal Tongue: None, normal,Extremity Movements Upper (arms, wrists, hands, fingers): None, normal Lower (legs, knees, ankles, toes): None, normal, Trunk Movements Neck, shoulders, hips: None, normal, Overall  Severity Severity of abnormal movements (highest score from questions above): None, normal Incapacitation due to abnormal movements: None, normal Patient's awareness of abnormal movements (rate only patient's report): No Awareness, Dental Status Current problems with teeth and/or dentures?: No Does patient usually wear dentures?: No  CIWA:    COWS:     Musculoskeletal: Strength & Muscle Tone: within normal limits Gait & Station: normal Patient leans: N/A   Psychiatric Specialty Exam:  Presentation  General Appearance: Appropriate for Environment  Eye Contact:Good  Speech:Clear and Coherent  Speech Volume:Normal  Handedness:Right   Mood and Affect  Mood:Depressed  Affect:Blunt; Congruent   Thought Process  Thought Processes:Coherent  Descriptions of Associations:Intact  Orientation:Full (Time, Place and Person)  Thought Content:Logical  History of Schizophrenia/Schizoaffective disorder:No  Duration of Psychotic Symptoms:No data recorded Hallucinations:No data recorded Ideas of Reference:None  Suicidal Thoughts:No data recorded Homicidal Thoughts:No data recorded  Sensorium  Memory:Immediate Good; Recent Good; Remote Good  Judgment:Fair  Insight:Fair   Executive Functions  Concentration:Good  Attention Span:Good  [place] of Knowledge:Good  Language:Good   Psychomotor Activity  Psychomotor Activity:No data recorded  Assets  Assets:Communication Skills; Desire for Improvement   Sleep  Sleep:No data recorded   Physical Exam: Physical Exam Vitals and nursing note reviewed.  Constitutional:      Appearance: Normal appearance.  HENT:     Head: Normocephalic and atraumatic.     Mouth/Throat:     Pharynx: Oropharynx is clear.  Eyes:     Pupils: Pupils are equal, round, and reactive to light.  Cardiovascular:     Rate and Rhythm: Normal rate and regular rhythm.  Pulmonary:     Effort: Pulmonary effort is normal.      Breath sounds: Normal breath sounds.  Abdominal:     General: Abdomen is flat.     Palpations: Abdomen is soft.  Musculoskeletal:        General: Normal range of motion.  Skin:    General: Skin is warm and dry.  Neurological:     General: No focal deficit present.     Mental Status: He is alert. Mental status is at baseline.  Psychiatric:        Attention and Perception: Attention normal.        Mood and Affect: Mood normal.        Speech: Speech normal.        Behavior: Behavior normal.        Thought Content: Thought content normal.        Cognition and Memory: Cognition normal.        Judgment: Judgment normal.    Review of Systems  Constitutional: Negative.   HENT: Negative.    Eyes: Negative.   Respiratory: Negative.    Cardiovascular: Negative.   Gastrointestinal: Negative.   Musculoskeletal: Negative.   Skin: Negative.   Neurological: Negative.   Psychiatric/Behavioral: Negative.     Blood pressure 140/84, pulse (!) 57, temperature 98.4 F (36.9 C), temperature source Oral, resp. rate 18, height 5\' 10"  (1.778  m), weight 83.9 kg, SpO2 100 %. Body mass index is 26.54 kg/m.   Social History   Tobacco Use  Smoking Status Every Day   Packs/day: 0.50   Types: Cigarettes  Smokeless Tobacco Never   Tobacco Cessation:  A prescription for an FDA-approved tobacco cessation medication was offered at discharge and the patient refused   Blood Alcohol level:  Lab Results  Component Value Date   ETH <10 07/25/2021    Metabolic Disorder Labs:  No results found for: "HGBA1C", "MPG" No results found for: "PROLACTIN" No results found for: "CHOL", "TRIG", "HDL", "CHOLHDL", "VLDL", "LDLCALC"  See Psychiatric Specialty Exam and Suicide Risk Assessment completed by Attending Physician prior to discharge.  Discharge destination:  Home  Is patient on multiple antipsychotic therapies at discharge:  No   Has Patient had three or more failed trials of antipsychotic  monotherapy by history:  No  Recommended Plan for Multiple Antipsychotic Therapies: NA  Discharge Instructions     Diet - low sodium heart healthy   Complete by: As directed    Increase activity slowly   Complete by: As directed       Allergies as of 07/29/2021   No Known Allergies      Medication List     STOP taking these medications    benzonatate 100 MG capsule Commonly known as: Tessalon Perles   brompheniramine-pseudoephedrine-DM 30-2-10 MG/5ML syrup   ondansetron 4 MG disintegrating tablet Commonly known as: ZOFRAN-ODT       TAKE these medications      Indication  FLUoxetine 20 MG capsule Commonly known as: PROZAC Take 1 capsule (20 mg total) by mouth daily. Start taking on: July 30, 2021  Indication: Depression   traZODone 100 MG tablet Commonly known as: DESYREL Take 1 tablet (100 mg total) by mouth at bedtime as needed for sleep.  Indication: Trouble Sleeping        Follow-up Information     [facility] Follow up.   Why: You are scheduled to meet Lorella Nimrod at [facility] on Wednesday, 07/31/21 at 7:30AM. Thanks! Contact information: [address] [place] [address] [phone]                 Follow-up recommendations: Strongly recommend follow-up with [facility].  Continue current medication.  Avoid amphetamines alcohol and other intoxicants.  Comments: Wrist cut was very superficial requiring no further treatment at this time.  Prescriptions and supply given at discharge.  Signed: Mordecai Rasmussen, MD 07/29/2021, 2:04 PM

## 2021-07-29 NOTE — Progress Notes (Signed)
D: Pt A & O X 4. Denies SI, HI, AVH and pain at this time. D/C home as ordered. Picked up in front of medical mall by [employer] cab.  A: D/C instructions reviewed with pt including prescriptions, medication samples and follow up appointment with [facility] provided to pt; compliance encouraged. All belongings from assigned locker returned to pt at time of departure. Scheduled medications given with verbal education and effects monitored. Safety checks maintained without incident till time of d/c.  R: Pt receptive to care. Compliant with medications when offered. Denies adverse drug reactions when assessed. Verbalized understanding related to d/c instructions. Signed belonging sheet in agreement with items received from locker. Ambulatory with a steady gait. Appears to be in no physical distress at time of departure.

## 2021-07-29 NOTE — BH IP Treatment Plan (Addendum)
Interdisciplinary Treatment and Diagnostic Plan Update  07/29/2021 Time of Session: 9:30 AM Nathan Pena MRN: [id number]  Principal Diagnosis: Major depressive disorder, recurrent severe without psychotic features (HCC)  Secondary Diagnoses: Principal Problem:   Major depressive disorder, recurrent severe without psychotic features (HCC)   Current Medications:  Current Facility-Administered Medications  Medication Dose Route Frequency Provider Last Rate Last Admin   acetaminophen (TYLENOL) tablet 650 mg  650 mg Oral Q6H PRN Charm Rings, NP       alum & mag hydroxide-simeth (MAALOX/MYLANTA) 200-200-20 MG/5ML suspension 30 mL  30 mL Oral Q4H PRN Charm Rings, NP       FLUoxetine (PROZAC) capsule 20 mg  20 mg Oral Daily [name], DO   20 mg at 07/29/21 0818   LORazepam (ATIVAN) tablet 1 mg  1 mg Oral Q4H PRN [name], DO   1 mg at 07/27/21 2131   LORazepam (ATIVAN) tablet 1 mg  1 mg Oral Once [name], DO       magnesium hydroxide (MILK OF MAGNESIA) suspension 30 mL  30 mL Oral Daily PRN Charm Rings, NP       OLANZapine (ZYPREXA) tablet 5 mg  5 mg Oral Q6H PRN [name], DO       risperiDONE (RISPERDAL) tablet 0.5 mg  0.5 mg Oral BH-[id number] [name], DO   0.5 mg at 07/29/21 0818   traZODone (DESYREL) tablet 100 mg  100 mg Oral QHS [name], DO   100 mg at 07/27/21 2131   PTA Medications: Medications Prior to Admission  Medication Sig Dispense Refill Last Dose   benzonatate (TESSALON PERLES) 100 MG capsule Take 1 capsule (100 mg total) by mouth 3 (three) times daily as needed for cough. (Patient not taking: Reported on 07/26/2021) 30 capsule 0    brompheniramine-pseudoephedrine-DM 30-2-10 MG/5ML syrup Take 10 mLs by mouth 4 (four) times daily as needed. (Patient not taking: Reported on 07/26/2021) 200 mL 0    ondansetron (ZOFRAN-ODT) 4 MG disintegrating tablet Take 1 tablet (4 mg total) by mouth  every 8 (eight) hours as needed for nausea or vomiting. (Patient not taking: Reported on 07/26/2021) 20 tablet 0     Patient Stressors: Financial difficulties   Substance abuse    Patient Strengths: Supportive family/friends   Treatment Modalities: Medication Management, Group therapy, Case management,  1 to 1 session with clinician, Psychoeducation, Recreational therapy.   Physician Treatment Plan for Primary Diagnosis: Major depressive disorder, recurrent severe without psychotic features (HCC) Long Term Goal(s): Improvement in symptoms so as ready for discharge   Short Term Goals: Ability to identify changes in lifestyle to reduce recurrence of condition will improve Ability to verbalize feelings will improve Ability to disclose and discuss suicidal ideas Ability to demonstrate self-control will improve Ability to identify and develop effective coping behaviors will improve Ability to maintain clinical measurements within normal limits will improve Compliance with prescribed medications will improve Ability to identify triggers associated with substance abuse/mental health issues will improve  Medication Management: Evaluate patient's response, side effects, and tolerance of medication regimen.  Therapeutic Interventions: 1 to 1 sessions, Unit Group sessions and Medication administration.  Evaluation of Outcomes: Progressing  Physician Treatment Plan for Secondary Diagnosis: Principal Problem:   Major depressive disorder, recurrent severe without psychotic features (HCC)  Long Term Goal(s): Improvement in symptoms so as ready for discharge   Short Term Goals: Ability to identify changes in lifestyle to reduce recurrence  of condition will improve Ability to verbalize feelings will improve Ability to disclose and discuss suicidal ideas Ability to demonstrate self-control will improve Ability to identify and develop effective coping behaviors will improve Ability to maintain  clinical measurements within normal limits will improve Compliance with prescribed medications will improve Ability to identify triggers associated with substance abuse/mental health issues will improve     Medication Management: Evaluate patient's response, side effects, and tolerance of medication regimen.  Therapeutic Interventions: 1 to 1 sessions, Unit Group sessions and Medication administration.  Evaluation of Outcomes: Progressing   RN Treatment Plan for Primary Diagnosis: Major depressive disorder, recurrent severe without psychotic features (HCC) Long Term Goal(s): Knowledge of disease and therapeutic regimen to maintain health will improve  Short Term Goals: Ability to remain free from injury will improve, Ability to verbalize frustration and anger appropriately will improve, Ability to demonstrate self-control, Ability to participate in decision making will improve, Ability to verbalize feelings will improve, Ability to disclose and discuss suicidal ideas, Ability to identify and develop effective coping behaviors will improve, and Compliance with prescribed medications will improve  Medication Management: RN will administer medications as ordered by provider, will assess and evaluate patient's response and provide education to patient for prescribed medication. RN will report any adverse and/or side effects to prescribing provider.  Therapeutic Interventions: 1 on 1 counseling sessions, Psychoeducation, Medication administration, Evaluate responses to treatment, Monitor vital signs and CBGs as ordered, Perform/monitor CIWA, COWS, AIMS and Fall Risk screenings as ordered, Perform wound care treatments as ordered.  Evaluation of Outcomes: Progressing   LCSW Treatment Plan for Primary Diagnosis: Major depressive disorder, recurrent severe without psychotic features (HCC) Long Term Goal(s): Safe transition to appropriate next level of care at discharge, Engage patient in therapeutic  group addressing interpersonal concerns.  Short Term Goals: Engage patient in aftercare planning with referrals and resources, Increase social support, Increase ability to appropriately verbalize feelings, Increase emotional regulation, Facilitate acceptance of mental health diagnosis and concerns, Facilitate patient progression through stages of change regarding substance use diagnoses and concerns, Identify triggers associated with mental health/substance abuse issues, and Increase skills for wellness and recovery  Therapeutic Interventions: Assess for all discharge needs, 1 to 1 time with Social worker, Explore available resources and support systems, Assess for adequacy in community support network, Educate family and significant other(s) on suicide prevention, Complete Psychosocial Assessment, Interpersonal group therapy.  Evaluation of Outcomes: Progressing   Progress in Treatment: Attending groups: Yes. Participating in groups: Yes. Taking medication as prescribed: Yes. Toleration medication: Yes. Family/Significant other contact made: Yes, individual(s) contacted:  partner, Alphonzo Dublin. Patient understands diagnosis: Yes. Discussing patient identified problems/goals with staff: Yes. Medical problems stabilized or resolved: Yes. Denies suicidal/homicidal ideation: Yes. Issues/concerns per patient self-inventory: No. Other: none.  New problem(s) identified: No, Describe:  none identified.  New Short Term/Long Term Goal(s): medication management for mood stabilization; elimination of SI thoughts; development of comprehensive mental wellness/sobriety plan.  Patient Goals:  "Basically I just came here to see how outpatient gone go. Outside of that I'm fine.  Discharge Plan or Barriers: CSW will assist pt with development of an appropriate aftercare/discharge plan. Pt expressed interest in following up with [facility] and discussed meeting with Lorella Nimrod on Wednesday in discharged by that date  (07/31/21). However, if not discharged then potential appointment on Friday instead.  Reason for Continuation of Hospitalization: Depression Medication stabilization Suicidal ideation  Estimated Length of Stay: 1-7 days  Last 3 [place] Suicide Severity Risk Score: Flowsheet  Row Admission (Current) from 07/26/2021 in [facility] INPATIENT BEHAVIORAL MEDICINE ED from 07/25/2021 in [facility] EMERGENCY DEPARTMENT ED from 03/06/2021 in [facility] EMERGENCY DEPARTMENT  C-SSRS RISK CATEGORY High Risk High Risk No Risk       Last PHQ 2/9 Scores:     No data to display          Scribe for Treatment Team: Glenis Smoker, [place] 07/29/2021 9:47 AM

## 2021-07-29 NOTE — Progress Notes (Addendum)
  [facility] Adult Case Management Discharge Plan :  Will you be returning to the same living situation after discharge:  No. Pt to return to his mother's home. At discharge, do you have transportation home?: Yes,  pt mother to provide transportation home. Do you have the ability to pay for your medications: No.  Release of information consent forms completed and in the chart;  Patient's signature needed at discharge.  Patient to Follow up at:  Follow-up Information     [facility] Follow up.   Why: You are scheduled to meet Lorella Nimrod at [facility] on Wednesday, 07/31/21 at 7:30AM. Thanks! Contact information: [address] [place] [address] [phone]                 Next level of care provider has access to [facility] Link:no  Safety Planning and Suicide Prevention discussed: Yes,  SPE completed with partner, Alphonzo Dublin.     Has patient been referred to the Quitline?: Patient refused referral  Patient has been referred for addiction treatment: Yes  Glenis Smoker, LCSW 07/29/2021, 11:46 AM

## 2021-07-29 NOTE — BHH Suicide Risk Assessment (Signed)
[facility] Discharge Suicide Risk Assessment   Principal Problem: Major depressive disorder, recurrent severe without psychotic features [facility] Discharge Diagnoses: Principal Problem:   Major depressive disorder, recurrent severe without psychotic features (HCC)   Total Time spent with patient: 30 minutes  Musculoskeletal: Strength & Muscle Tone: within normal limits Gait & Station: normal Patient leans: N/A  Psychiatric Specialty Exam  Presentation  General Appearance: Appropriate for Environment  Eye Contact:Good  Speech:Clear and Coherent  Speech Volume:Normal  Handedness:Right   Mood and Affect  Mood:Depressed  Duration of Depression Symptoms: Greater than two weeks  Affect:Blunt; Congruent   Thought Process  Thought Processes:Coherent  Descriptions of Associations:Intact  Orientation:Full (Time, Place and Person)  Thought Content:Logical  History of Schizophrenia/Schizoaffective disorder:No  Duration of Psychotic Symptoms:No data recorded Hallucinations:No data recorded Ideas of Reference:None  Suicidal Thoughts:No data recorded Homicidal Thoughts:No data recorded  Sensorium  Memory:Immediate Good; Recent Good; Remote Good  Judgment:Fair  Insight:Fair   Executive Functions  Concentration:Good  Attention Span:Good  Recall:Good  Fund of Knowledge:Good  Language:Good   Psychomotor Activity  Psychomotor Activity:No data recorded  Assets  Assets:Communication Skills; Desire for Improvement   Sleep  Sleep:No data recorded  Physical Exam: Physical Exam Vitals and nursing note reviewed.  Constitutional:      Appearance: Normal appearance.  HENT:     Head: Normocephalic and atraumatic.     Mouth/Throat:     Pharynx: Oropharynx is clear.  Eyes:     Pupils: Pupils are equal, round, and reactive to light.  Cardiovascular:     Rate and Rhythm: Normal rate and regular rhythm.  Pulmonary:     Effort: Pulmonary effort is normal.      Breath sounds: Normal breath sounds.  Abdominal:     General: Abdomen is flat.     Palpations: Abdomen is soft.  Musculoskeletal:        General: Normal range of motion.  Skin:    General: Skin is warm and dry.  Neurological:     General: No focal deficit present.     Mental Status: He is alert. Mental status is at baseline.  Psychiatric:        Attention and Perception: Attention normal.        Mood and Affect: Mood normal.        Speech: Speech normal.        Behavior: Behavior normal.        Thought Content: Thought content normal.        Cognition and Memory: Cognition normal.        Judgment: Judgment normal.    Review of Systems  Constitutional: Negative.   HENT: Negative.    Eyes: Negative.   Respiratory: Negative.    Cardiovascular: Negative.   Gastrointestinal: Negative.   Musculoskeletal: Negative.   Skin: Negative.   Neurological: Negative.   Psychiatric/Behavioral: Negative.     Blood pressure 140/84, pulse (!) 57, temperature 98.4 F (36.9 C), temperature source Oral, resp. rate 18, height 5\' 10"  (1.778 m), weight 83.9 kg, SpO2 100 %. Body mass index is 26.54 kg/m.  Mental Status Per Nursing Assessment::   On Admission:  NA  Demographic Factors:  Male, Caucasian, and Low socioeconomic status  Loss Factors: Financial problems/change in socioeconomic status  Historical Factors: Impulsivity  Risk Reduction Factors:   Sense of responsibility to family and Positive social support  Continued Clinical Symptoms:  Depression:   Impulsivity Alcohol/Substance Abuse/Dependencies  Cognitive Features That Contribute To Risk:  None  Suicide Risk:  Minimal: No identifiable suicidal ideation.  Patients presenting with no risk factors but with morbid ruminations; may be classified as minimal risk based on the severity of the depressive symptoms   Follow-up Information     [facility] Follow up.   Why: You are scheduled to meet Lorella Nimrod at [facility]  on Wednesday, 07/31/21 at 7:30AM. Thanks! Contact information: [address] [place] [address] [phone]                 Plan Of Care/Follow-up recommendations:  Continue current medication.  Recommend follow-up with [facility] and engagement in substance abuse treatment to discontinue methamphetamine abuse and received treatment for depression.  Patient denies any suicidal ideation and appears calm and motivated for treatment with a positive plan for the future  Mordecai Rasmussen, MD 07/29/2021, 11:32 AM

## 2021-07-29 NOTE — Group Note (Addendum)
[facility] LCSW Group Therapy Note    Group Date: 07/29/2021 Start Time: 1400 End Time: 1500  Type of Therapy and Topic:  Group Therapy:  Overcoming Obstacles  Participation Level:  [facility] PARTICIPATION LEVEL: Did Not Attend  Description of Group:   In this group patients will be encouraged to explore what they see as obstacles to their own wellness and recovery. They will be guided to discuss their thoughts, feelings, and behaviors related to these obstacles. The group will process together ways to cope with barriers, with attention given to specific choices patients can make. Each patient will be challenged to identify changes they are motivated to make in order to overcome their obstacles. This group will be process-oriented, with patients participating in exploration of their own experiences as well as giving and receiving support and challenge from other group members.  Therapeutic Goals: 1. Patient will identify personal and current obstacles as they relate to admission. 2. Patient will identify barriers that currently interfere with their wellness or overcoming obstacles.  3. Patient will identify feelings, thought process and behaviors related to these barriers. 4. Patient will identify two changes they are willing to make to overcome these obstacles:    Summary of Patient Progress Pt declined to attend despite invitation.    Therapeutic Modalities:   Cognitive Behavioral Therapy Solution Focused Therapy Motivational Interviewing Relapse Prevention Therapy   Glenis Smoker, LCSW

## 2021-07-29 NOTE — [facility] Group Notes (Signed)
[facility] Group Notes:  (Nursing/MHT/Case Management/Adjunct)  Date:  07/29/2021  Time:  10:11 AM  Type of Therapy:   Community Meeting  Participation Level:  Active  Participation Quality:  Appropriate and Attentive  Affect:  Appropriate  Cognitive:  Alert and Appropriate  Insight:  Appropriate  Engagement in Group:  Engaged  Modes of Intervention:  Discussion, Education, and Support  Summary of Progress/Problems:  Nathan Pena Nathan Pena 07/29/2021, 10:11 AM

## 2022-03-30 ENCOUNTER — Other Ambulatory Visit: Payer: Self-pay

## 2022-03-30 ENCOUNTER — Encounter: Payer: Self-pay | Admitting: Emergency Medicine

## 2022-03-30 ENCOUNTER — Emergency Department: Payer: Self-pay

## 2022-03-30 ENCOUNTER — Emergency Department
Admission: EM | Admit: 2022-03-30 | Discharge: 2022-03-31 | Disposition: A | Discharge status: Home / Self Care | Payer: Self-pay | Attending: Emergency Medicine | Admitting: Emergency Medicine

## 2022-03-30 DIAGNOSIS — U071 COVID-19: Secondary | ICD-10-CM | POA: Insufficient documentation

## 2022-03-30 DIAGNOSIS — R55 Syncope and collapse: Secondary | ICD-10-CM | POA: Insufficient documentation

## 2022-03-30 DIAGNOSIS — Z28310 Unvaccinated for covid-19: Secondary | ICD-10-CM | POA: Insufficient documentation

## 2022-03-30 DIAGNOSIS — R042 Hemoptysis: Secondary | ICD-10-CM

## 2022-03-30 LAB — BASIC METABOLIC PANEL
Anion gap: 5 (ref 5–15)
BUN: 13 mg/dL (ref 6–20)
CO2: 25 mmol/L (ref 22–32)
Calcium: 8.3 mg/dL — ABNORMAL LOW (ref 8.9–10.3)
Chloride: 107 mmol/L (ref 98–111)
Creatinine, Ser: 0.91 mg/dL (ref 0.61–1.24)
GFR, Estimated: >60 mL/min (ref >60)
Glucose, Bld: 88 mg/dL (ref 70–99)
Potassium: 4 mmol/L (ref 3.5–5.1)
Sodium: 137 mmol/L (ref 135–145)

## 2022-03-30 LAB — TROPONIN I (HIGH SENSITIVITY): Troponin I (High Sensitivity): <2 ng/L (ref <18)

## 2022-03-30 LAB — RESP PANEL BY RT-PCR (RSV, FLU A&B, COVID)  RVPGX2
Influenza A by PCR: NEGATIVE
Influenza B by PCR: NEGATIVE
Resp Syncytial Virus by PCR: NEGATIVE
SARS Coronavirus 2 by RT PCR: POSITIVE — AB

## 2022-03-30 LAB — CBC
HCT: 50.2 % (ref 39.0–52.0)
Hemoglobin: 16.7 g/dL (ref 13.0–17.0)
MCH: 29.4 pg (ref 26.0–34.0)
MCHC: 33.3 g/dL (ref 30.0–36.0)
MCV: 88.4 fL (ref 80.0–100.0)
Platelets: 236 10*3/uL (ref 150–400)
RBC: 5.68 MIL/uL (ref 4.22–5.81)
RDW: 12.2 % (ref 11.5–15.5)
WBC: 8.5 10*3/uL (ref 4.0–10.5)
nRBC: 0 % (ref 0.0–0.2)

## 2022-03-30 MED ORDER — IOHEXOL 350 MG/ML SOLN
100.0000 mL | Freq: Once | INTRAVENOUS | Status: AC | PRN
Start: 2022-03-30 — End: 2022-03-30
  Administered 2022-03-30: 100 mL via INTRAVENOUS

## 2022-03-30 NOTE — ED Triage Notes (Signed)
Pt arrived via POV with wife, pt reports coughing up blood for over a year, pt states he had coughed up blood tonight and was standing at the sink and had a syncopal episode.    Pt states he coughs up a mix of blood clots and blood streaked sputum x 1 year.   Pt states he has not been seen for this yet and states it has been getting worse.  Pt smokes reports 1/2-3/4 pack per day.

## 2022-03-30 NOTE — ED Provider Notes (Signed)
[facility] Provider Note    Event Date/Time   First MD Initiated Contact with Patient 03/30/22 2300     (approximate)   History   Hemoptysis and Loss of Consciousness   HPI  Nathan Pena is a 32 y.o. male who comes in with concerns for cough.  Patient reports on Wednesday he did feel sick with fevers.  He reports that he has been coughing up blood for over a year.  He reports that sometimes it is just blood-streaked sputum as well as some blood clots.  He states that today he was standing at the sink when he is coughed up some blood and he then got lightheaded and passed out.  He states he is not sure if he hit his head.  Denies any abdominal pain.  He does report continuing to smoke.  He reports having these episodes of coughing up blood previously for over a year where he coughs up blood probably 3-5 times a week.  He states the episode today was not as severe as he had previously.   he states that he is never seen a doctor for it because he does not like going to the doctor.  He states that he does not have his COVID vaccines.  Patient denies any SI.  Physical Exam   Triage Vital Signs: ED Triage Vitals  Enc Vitals Group     BP 03/30/22 2139 135/85     Pulse Rate 03/30/22 2139 82     Resp 03/30/22 2139 18     Temp 03/30/22 2139 98.4 F (36.9 C)     Temp Source 03/30/22 2139 Oral     SpO2 03/30/22 2139 95 %     Weight 03/30/22 2133 202 lb (91.6 kg)     Height 03/30/22 2133 '5\' 10"'$  (1.778 m)     Head Circumference --      Peak Flow --      Pain Score 03/30/22 2133 0     Pain Loc --      Pain Edu? --      Excl. in [place]? --     Most recent vital signs: Vitals:   03/30/22 2139  BP: 135/85  Pulse: 82  Resp: 18  Temp: 98.4 F (36.9 C)  SpO2: 95%     General: Awake, no distress.  CV:  Good peripheral perfusion.  Resp:  Normal effort.  No wheezing Abd:  No distention.  Other:  Clear lungs.  No swelling in legs.  Abdomen soft nontender.  No  C-spine tenderness.  Full range of motion of neck.   ED Results / Procedures / Treatments   Labs (all labs ordered are listed, but only abnormal results are displayed) Labs Reviewed  RESP PANEL BY RT-PCR (RSV, FLU A&B, COVID)  RVPGX2 - Abnormal; Notable for the following components:      Result Value   SARS Coronavirus 2 by RT PCR POSITIVE (*)    All other components within normal limits  BASIC METABOLIC PANEL - Abnormal; Notable for the following components:   Calcium 8.3 (*)    All other components within normal limits  CBC  TROPONIN I (HIGH SENSITIVITY)  TROPONIN I (HIGH SENSITIVITY)     EKG  My interpretation of EKG:  Normal sinus rate of 83 without any ST elevation or T wave inversions except for aVL, normal intervals  RADIOLOGY I have reviewed the xray personally and interpreted and no evidence of any pneumonia   PROCEDURES:  Critical Care performed: No  .1-3 Lead EKG Interpretation  Performed by: Vanessa Brewster, MD Authorized by: Vanessa Valencia, MD     Interpretation: normal     ECG rate:  70   ECG rate assessment: normal     Rhythm: sinus rhythm     Ectopy: none     Conduction: normal      MEDICATIONS ORDERED IN ED: Medications - No data to display   IMPRESSION / MDM / [place] / ED COURSE  I reviewed the triage vital signs and the nursing notes.   Patient's presentation is most consistent with acute presentation with potential threat to life or bodily function.   Patient comes in with hemoptysis and loss of consciousness.  EKG, cardiac markers ordered to evaluate for arrhythmia, ACS.  Will get CT PE to rule out pulmonary embolism, lung mass.  Patient's COVID test is positive.  Discussed with patient admission for cardiac monitoring, echo, trending of hemoglobins but given this is been ongoing for over a year with the coughing up blood patient is not sure if he wants to stay in the hospital.  He is willing to at least do the CT imaging and  will think about staying or not.  We also discussed paxlovid since he is currently day 5 of symptoms but his COVID symptoms have now since mostly resolved still just this hemoptysis that he has been having for over a year.  CT head also ordered to make sure no evidence of intracranial hemorrhage  Troponins are negative x 2.  BMP normal hepatic function normal CBC shows stable hemoglobin.  COVID test was positive.  CT imaging of head and lungs were negative.  Reevaluated patient he had no coughing up blood here's for over 3 hours.  I again recommended admission.  We discussed that they would trend out hemoglobins, echocardiogram, potentially discuss with pulmonary and that he may need bronchoscopy but unclear if this would happen inpatient given his COVID-positive state.  Given patient states that this has been ongoing for over a year he does not want to be admitted to the hospital.  He understands the risk for death or permanent disability but given his reassuring workup you prefer to follow-up outpatient.  Will place pulmonary referral.  However at this time he does not want to stay in the hospital.  His partner is at bedside who witnesses this conversation.  She will be around him.  He understands that if he changes his mind he can always return to the ER for repeat evaluation.  We did discuss try to take a picture next time so we can see how much blood is coming up and he expressed understanding.  Will get ambulatory sat to make sure not hypoxic prior to discharge.  12:52 AM   His vitals remained stable he is got normal oxygen levels with ambulation and he again declines admission.  He has no interest in staying in the hospital.  We discussed paxlovid and again declines.  We did discuss smoking sensation.  The patient is on the cardiac monitor to evaluate for evidence of arrhythmia and/or significant heart rate changes.      FINAL CLINICAL IMPRESSION(S) / ED DIAGNOSES   Final diagnoses:  COVID-19   Hemoptysis     Rx / DC Orders   ED Discharge Orders          Ordered    Ambulatory referral to Pulmonology        03/31/22 0053  Note:  This document was prepared using Dragon voice recognition software and may include unintentional dictation errors.   Vanessa [place], MD 03/31/22 [phone]

## 2022-03-31 LAB — HEPATIC FUNCTION PANEL
ALT: 37 U/L (ref 0–44)
AST: 26 U/L (ref 15–41)
Albumin: 3.9 g/dL (ref 3.5–5.0)
Alkaline Phosphatase: 63 U/L (ref 38–126)
Bilirubin, Direct: <0.1 mg/dL (ref 0.0–0.2)
Total Bilirubin: 0.6 mg/dL (ref 0.3–1.2)
Total Protein: 7.3 g/dL (ref 6.5–8.1)

## 2022-03-31 LAB — TROPONIN I (HIGH SENSITIVITY): Troponin I (High Sensitivity): <2 ng/L (ref <18)

## 2022-03-31 NOTE — Discharge Instructions (Addendum)
We recommended admission to monitor the coughing up blood, get echocardiogram of your heart and discuss further with pulmonology but at this time you have declined admission.  However if your symptoms are worsening can always return to the ER for repeat evaluation develop worsening shortness of breath or any other concerns.  Otherwise I placed a referral for pulmonary for you to follow-up with.  You are COVID-positive so this could also be contributing to your symptoms but your day 5 of illness.  Try to cut down your smoking as this can also be contributing.

## 2022-03-31 NOTE — ED Notes (Signed)
97%-93% O2 sat while ambulating

## 2022-04-02 ENCOUNTER — Other Ambulatory Visit: Payer: Self-pay

## 2022-04-02 ENCOUNTER — Encounter: Payer: Self-pay | Admitting: Intensive Care

## 2022-04-02 ENCOUNTER — Emergency Department
Admission: EM | Admit: 2022-04-02 | Discharge: 2022-04-02 | Disposition: A | Discharge status: Home / Self Care | Payer: Self-pay | Attending: Emergency Medicine | Admitting: Emergency Medicine

## 2022-04-02 DIAGNOSIS — K047 Periapical abscess without sinus: Secondary | ICD-10-CM | POA: Insufficient documentation

## 2022-04-02 DIAGNOSIS — K029 Dental caries, unspecified: Secondary | ICD-10-CM | POA: Insufficient documentation

## 2022-04-02 MED ORDER — OXYCODONE HCL 5 MG PO TABS
5.0000 mg | ORAL_TABLET | Freq: Once | ORAL | Status: AC
  Administered 2022-04-02: 5 mg via ORAL
  Filled 2022-04-02: qty 1

## 2022-04-02 MED ORDER — AMOXICILLIN-POT CLAVULANATE 875-125 MG PO TABS: 1.0000 | ORAL_TABLET | Freq: Two times a day (BID) | ORAL | 0 refills | Status: AC

## 2022-04-02 MED ORDER — CHLORHEXIDINE GLUCONATE 0.12 % MT SOLN: 15.0000 mL | Freq: Two times a day (BID) | OROMUCOSAL | 0 refills | Status: AC

## 2022-04-02 NOTE — Discharge Instructions (Addendum)
Please take antibiotics as prescribed.  Please follow-up with a dentist to soon as possible.  Please return for any new, worsening, or change in symptoms or other concerns.  It was a pleasure caring for you today.

## 2022-04-02 NOTE — ED Provider Notes (Signed)
[facility] Provider Note    Event Date/Time   First MD Initiated Contact with Patient 04/02/22 1451     (approximate)   History   Facial Swelling and Dental Pain   HPI  Nathan Pena is a 32 y.o. male who presents today for evaluation of dental pain.  Patient reports that this began yesterday.  He reports that he has known cavities, but does not have a dentist.  He feels pressure in his left upper teeth today.  He reports that he has pain with chewing.  No fevers or chills.  No voice change.  No trouble swallowing.  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 --      Excl. in [place]? --     Most recent vital signs: Vitals:   04/02/22 1352  BP: (!) 158/112  Pulse: 98  Resp: 20  Temp: 98.4 F (36.9 C)  SpO2: 97%    Physical Exam Vitals and nursing note reviewed.  Constitutional:      General: Awake and alert. No acute distress.    Appearance: Normal appearance. The patient is normal weight.  HENT:     Head: Normocephalic and atraumatic.     Mouth: Mucous membranes are moist.  Poor dentition diffusely with multiple decaying teeth and obvious dental caries.  No gingival fluctuance or abscess.  No facial swelling  or erythema.  No neck swelling or erythema.  No trismus.  No sublingual swelling. Eyes:     General: PERRL. Normal EOMs        Right eye: No discharge.        Left eye: No discharge.     Conjunctiva/sclera: Conjunctivae normal.  Cardiovascular:     Rate and Rhythm: Normal rate and regular rhythm.     Pulses: Normal pulses.  Pulmonary:     Effort: Pulmonary effort is normal. No respiratory distress.     Breath sounds: Normal breath sounds.  Abdominal:     Abdomen is soft. There is no abdominal tenderness. No rebound or guarding. No distention. Musculoskeletal:        General: No swelling. Normal range of motion.     Cervical back: Normal range of motion and neck supple.  Skin:    General: Skin is warm and dry.     Capillary Refill: Capillary refill takes less than 2 seconds.     Findings: No rash.  Neurological:     Mental  Status: The patient is awake and alert.      ED Results / Procedures / Treatments   Labs (all labs ordered are listed, but only abnormal results are displayed) Labs Reviewed - No data to display   EKG     RADIOLOGY     PROCEDURES:  Critical Care performed:   Procedures   MEDICATIONS ORDERED IN ED: Medications  oxyCODONE (Oxy IR/ROXICODONE) immediate release tablet 5 mg (has no administration in time range)     IMPRESSION / MDM / ASSESSMENT AND PLAN / ED COURSE  I reviewed the triage vital signs and the nursing notes.   Differential diagnosis includes, but is not limited to, dental decay, dental caries, pulpitis, abscess.  Patient is awake and alert, hemodynamically stable and afebrile.  No facial or neck swelling or erythema.  Patient has obvious dental decay and dental caries. No gingival swelling or fluctuance concerning for gingival abscess.  No trismus, nuchal rigidity, neck pain, hot potato voice, uvular deviation or malocclusion to suggest deep space infection. No sublingual swelling concerning for Ludwig's angina.  Patient was  started on antibiotics and chlorhexidine mouth rinse.  Patient was treated symptomatically in the emergency department.  He was given a list of dentists and instructed to follow-up this week.  Discussed care plan, return precautions, and advised close outpatient follow-up with dentist. Patient agrees with plan of care.  Discharged with significant other who will be driving.  Patient's presentation is most consistent with acute complicated illness / injury requiring diagnostic workup.      FINAL CLINICAL IMPRESSION(S) / ED DIAGNOSES   Final diagnoses:  Dental infection  Pain due to dental caries     Rx / DC Orders   ED Discharge Orders          Ordered    amoxicillin-clavulanate (AUGMENTIN) 875-125 MG tablet  2 times daily        04/02/22 1453    chlorhexidine (PERIDEX) 0.12 % solution  2 times daily        04/02/22 1453             Note:  This document was prepared using Dragon voice recognition software and may include unintentional dictation errors.   Emeline Gins 04/02/22 1505    Harvest Dark, MD 04/03/22 1121

## 2022-04-02 NOTE — ED Triage Notes (Signed)
Patient presents with left sided facial pain/swelling and left sided dental pain. Reports multiple teeth issues on left side

## 2022-05-14 ENCOUNTER — Encounter: Payer: Self-pay | Admitting: Student in an Organized Health Care Education/Training Program

## 2022-05-14 ENCOUNTER — Ambulatory Visit (INDEPENDENT_AMBULATORY_CARE_PROVIDER_SITE_OTHER): Payer: 59 | Admitting: Student in an Organized Health Care Education/Training Program

## 2022-05-14 VITALS — BP 122/78 | HR 77 | Temp 98.0°F | Ht 70.0 in | Wt 226.0 lb

## 2022-05-14 DIAGNOSIS — R053 Chronic cough: Secondary | ICD-10-CM

## 2022-05-14 DIAGNOSIS — Z720 Tobacco use: Secondary | ICD-10-CM | POA: Diagnosis not present

## 2022-05-14 MED ORDER — NICOTINE 7 MG/24HR TD PT24: 7.0000 mg | MEDICATED_PATCH | TRANSDERMAL | 0 refills | Status: AC

## 2022-05-14 MED ORDER — NICOTINE 21 MG/24HR TD PT24: 21.0000 mg | MEDICATED_PATCH | TRANSDERMAL | 0 refills | Status: AC

## 2022-05-14 MED ORDER — BUDESONIDE-FORMOTEROL FUMARATE 160-4.5 MCG/ACT IN AERO: 2.0000 | INHALATION_SPRAY | Freq: Two times a day (BID) | RESPIRATORY_TRACT | 12 refills | Status: AC

## 2022-05-14 MED ORDER — NICOTINE POLACRILEX 2 MG MT LOZG: 2.0000 mg | LOZENGE | OROMUCOSAL | 3 refills | Status: AC | PRN

## 2022-05-14 MED ORDER — NICOTINE 14 MG/24HR TD PT24: 14.0000 mg | MEDICATED_PATCH | TRANSDERMAL | 0 refills | Status: AC

## 2022-05-14 NOTE — Progress Notes (Signed)
Synopsis: Referred in for chronic cough by Nathan Se, MD  Assessment & Plan:   1. 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 His chest CT from his recent ED visit shows faint mosaic attenuation in the bases with bronchial wall thickening which is suggestive of a reactive/obstructive process such as asthma. The fact that his cough is worse after a day's work and better over the weekend suggests occupational vs work-exacerbated asthma, especially in light of his childhood asthma. I did discuss at length the importance of wearing PPE, especially when working closely with dusts and fumes. We will also empirically start Symbicort as controller medicine for his asthma and obtain a pulmonary function test to assess the degree of obstruction.  - budesonide-formoterol (SYMBICORT) 160-4.5 MCG/ACT inhaler; Inhale 2 puffs into the lungs in the morning and at bedtime.  Dispense: 1 each; Refill: 12 - Pulmonary Function Test [facility] Only; Future  2. Tobacco use  I have counseled Nathan Pena on the importance of smoking cessation. He is willing to try and quit, and I have sent a prescription for nicotine lozenges and patches to aid in his pursuit. I have counseled him on the importance of smoking cessation for his cough and likely reactive airway disease.  - nicotine (NICODERM CQ - DOSED IN MG/24 HOURS) 14 mg/24hr patch; Place 1 patch (14 mg total) onto the skin daily for 14 days.  Dispense: 14 patch; Refill: 0 - nicotine (NICODERM CQ - DOSED IN MG/24 HOURS) 21 mg/24hr patch; Place 1 patch (21 mg total) onto the skin daily.  Dispense: 42 patch; Refill: 0 - nicotine polacrilex (NICOTINE MINI) 2 MG lozenge; Take 1 lozenge (2 mg total) by mouth every 2 (two) hours as needed for smoking cessation.  Dispense: 72 lozenge; Refill: 3 - nicotine (NICODERM CQ - DOSED IN MG/24 HR) 7 mg/24hr patch; Place 1 patch (7 mg total) onto the skin  daily for 14 days.  Dispense: 14 patch; Refill: 0   Return in about 4 weeks (around 06/11/2022).  I spent 60 minutes caring for this patient today, including preparing to see the patient, obtaining a medical history , reviewing a separately obtained history, performing a medically appropriate examination and/or evaluation, counseling and educating the patient/family/caregiver, ordering medications, tests, or procedures, documenting clinical information in the electronic health record, and independently interpreting results (not separately reported/billed) and communicating results to the patient/family/caregiver  Nathan Chute, MD [facility] 05/14/2022 4:02 PM    End of visit medications:  Meds ordered this encounter  Medications   budesonide-formoterol (SYMBICORT) 160-4.5 MCG/ACT inhaler    Sig: Inhale 2 puffs into the lungs in the morning and at bedtime.    Dispense:  1 each    Refill:  12   nicotine (NICODERM CQ - DOSED IN MG/24 HOURS) 14 mg/24hr patch    Sig: Place 1 patch (14 mg total) onto the skin daily for 14 days.    Dispense:  14 patch    Refill:  0   nicotine (NICODERM CQ - DOSED IN MG/24 HOURS) 21 mg/24hr patch    Sig: Place 1 patch (21 mg total) onto the skin daily.    Dispense:  42 patch    Refill:  0   nicotine polacrilex (NICOTINE MINI) 2 MG lozenge    Sig: Take 1 lozenge (2 mg total) by mouth every 2 (two) hours as needed for smoking cessation.    Dispense:  72 lozenge    Refill:  3   nicotine (NICODERM CQ - DOSED IN MG/24 HR) 7 mg/24hr patch    Sig: Place 1 patch (7 mg total) onto the skin daily for 14 days.    Dispense:  14 patch    Refill:  0     Current Outpatient Medications:    budesonide-formoterol (SYMBICORT) 160-4.5 MCG/ACT inhaler, Inhale 2 puffs into the lungs in the morning and at bedtime., Disp: 1 each, Rfl: 12   nicotine (NICODERM CQ - DOSED IN MG/24 HOURS) 14 mg/24hr patch, Place 1 patch (14 mg total) onto the skin daily for  14 days., Disp: 14 patch, Rfl: 0   nicotine (NICODERM CQ - DOSED IN MG/24 HOURS) 21 mg/24hr patch, Place 1 patch (21 mg total) onto the skin daily., Disp: 42 patch, Rfl: 0   nicotine (NICODERM CQ - DOSED IN MG/24 HR) 7 mg/24hr patch, Place 1 patch (7 mg total) onto the skin daily for 14 days., Disp: 14 patch, Rfl: 0   nicotine polacrilex (NICOTINE MINI) 2 MG lozenge, Take 1 lozenge (2 mg total) by mouth every 2 (two) hours as needed for smoking cessation., Disp: 72 lozenge, Rfl: 3   chlorhexidine (PERIDEX) 0.12 % solution, Use as directed 15 mLs in the mouth or throat 2 (two) times daily. (Patient not taking: Reported on 05/14/2022), Disp: 120 mL, Rfl: 0   FLUoxetine (PROZAC) 20 MG capsule, Take 1 capsule (20 mg total) by mouth daily. (Patient not taking: Reported on 05/14/2022), Disp: 10 capsule, Rfl: 0   traZODone (DESYREL) 100 MG tablet, Take 1 tablet (100 mg total) by mouth at bedtime as needed for sleep. (Patient not taking: Reported on 05/14/2022), Disp: 10 tablet, Rfl: 0   Subjective:   PATIENT ID: Nathan Pena GENDER: male DOB: January 25, 1990, MRN: [id number]  Chief Complaint  Patient presents with   pulmonary consult    Prod cough--occ mixed with bright red blood and wheezing at night x1y      HPI  Patient is a 32 year old male presenting to clinic for evaluation of cough.  Patient reports a cough that has been ongoing for the past 2 years, worsening over the past few months. The cough has gotten bad enough that it sometimes causes him to pass out. He has noticed some hemoptysis associated with this cough, mostly blood mixed with phlegm. He did cough up frank blood a couple of times, but this has not happened recently.  He has no fevers, no chills, no night sweats, no weight loss, no chest pain, and no chest tightness. Other symptoms reported include episodes of apnea described by the patient's wife. He was told he had sleep apnea when he was growing up as he had said apneic episodes  previously.  He was seen in the emergency department in March secondary to cough, hemoptysis, and associated syncope.  In the ED, he was found to be COVID-positive and a CT scan with PE protocol was overall within normal.  On my review, the CT scan does show some mosaic attenuation, especially in the lower lobes.  There was also some bronchial wall thickening.  Patient reports that his cough is mostly worse after he comes back home from work.  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  The last few times he had a coughing fit he was exchanging breaks with exposure to brake dust as well as a break cleaning fluid.  He  notices that his cough is worse during the week and less pronounced over the weekend. The cough is also worse after he comes back from work in the afternoon. He does have some wheezing, especially at night.  He currently feels well and is at his baseline with no coughing today.  Patient is a smoker, smokes 1 pack a day. He previously abused drugs but hasn't done so recently and denies any other inhalational exposure. He does report a history of childhood asthma which he feels he outgrew at the age of 48. Patient lives with wife and children at home. He's previously tried to quit but was unable to for longer than 6 months. Patient was previously incarcerated.  Ancillary information including prior medications, full medical/surgical/family/social histories, and PFTs (when available) are listed below and have been reviewed.   Review of Systems  Constitutional:  Negative for chills, fever, malaise/fatigue and weight loss.  Respiratory:  Positive for cough, hemoptysis and sputum production. Negative for shortness of breath.   Cardiovascular:  Negative for chest pain.  Skin:  Negative for rash.     Objective:   Vitals:   05/14/22 1525  BP: 122/78  Pulse: 77  Temp: 98 F (36.7 C)  TempSrc: Temporal  SpO2: 96%  Weight: 226 lb (102.5  kg)  Height:  (1.778 m)   96% on RA BMI Readings from Last 3 Encounters:  05/14/22 32.43 kg/m  04/02/22 28.98 kg/m  03/30/22 28.98 kg/m   Wt Readings from Last 3 Encounters:  05/14/22 226 lb (102.5 kg)  04/02/22 202 lb (91.6 kg)  03/30/22 202 lb (91.6 kg)    Physical Exam    Ancillary Information    Past Medical History:  Diagnosis Date   Asthma      No family history on file.   No past surgical history on file.  Social History   Socioeconomic History   Marital status: Single    Spouse name: Not on file   Number of children: Not on file   Years of education: Not on file   Highest education level: Not on file  Occupational History   Not on file  Tobacco Use   Smoking status: Every Day    Packs/day: 0.50    Years: 16.00    Additional pack years: 0.00    Total pack years: 8.00    Types: Cigarettes   Smokeless tobacco: Never  Vaping Use   Vaping Use: Never used  Substance and Sexual Activity   Alcohol use: Yes    Alcohol/week: 2.0 standard drinks of alcohol    Types: 2 Cans of beer per week    Comment: reports occasional alcohol use   Drug use: Yes    Types: Methamphetamines, Marijuana    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4 03/30/2022 2144   MCHC 33.3 03/30/2022 2144   RDW 12.2 03/30/2022 2144    Pulmonary  Functions Testing Results:     No data to display          Outpatient Medications Prior to Visit  Medication Sig Dispense Refill   chlorhexidine (PERIDEX) 0.12 % solution Use as directed 15 mLs in the mouth or throat 2 (two) times daily. (Patient not taking: Reported on 05/14/2022) 120 mL 0   FLUoxetine (PROZAC) 20 MG capsule Take 1 capsule (20 mg total) by mouth daily. (Patient not taking: Reported on 05/14/2022) 10 capsule 0   traZODone (DESYREL) 100 MG tablet Take 1 tablet (100 mg total) by mouth at bedtime as needed for sleep. (Patient not taking: Reported on 05/14/2022) 10 tablet 0   No facility-administered medications prior to visit.

## 2022-05-14 NOTE — Patient Instructions (Signed)
[facility] [place] [facility]: Call 1-800-QUIT-NOW (1-800-784-8669). The [place] [facility]uit[facility] is a free service for [place] residents. Trained counselors are available from 8 am until 3 am, 365 days per year. Services are available in both English and Spanish.   Web Resources Free online support programs can help you track your progress and share experiences with others who are quitting. These are examples: www.becomeanex.org www.trytostop.org  www.smokefree.gov  www.everydayhealth.com/smoking-cessation/index.aspx  [facility]: offers comprehensive in-person tobacco treatment counseling at [facility] ([address] [place] [address]).  Open to everyone. Virtual appointments available. Free parking. Call [phone] to schedule an appointment or [phone] for general information.    Tobacco Cessation Medications  Nicotine Replacement Therapy (NRT)  Nicotine is the addictive part of tobacco smoke, but not the most dangerous part. There are 7000 other toxins in cigarettes, including carbon monoxide, that cause disease. People do not generally become addicted to medication. Common problems: People don't use enough medication or stop too early. Medications are safe and effective. Overdose is very uncommon. Use medications as long as needed (3 months minimum). Some combinations work better than single medications. Long acting medications like the NRT patch and bupropion provide continuous treatment for withdrawal symptoms.  PLUS  Short acting medications like the NRT gum, lozenge, inhaler, and nasal spray help people to cope with breakthrough cravings.  ? Nicotine Patch  Place patch on hairless skin on upper body, including arms and back. Each day: discard old patch, shower, apply new patch to a different site. Apply hydrocortisone cream to mildly red/irritated areas. Call provider if rash develops. If patch causes sleep disturbance, remove patch  at bedtime and replace each morning after shower. Side effects may include: skin irritation, headache, insomnia, abnormal/vivid dreams.  ? Nicotine Gum  Chew gum slowly, park in cheek when peppery taste or tingling sensation begins (about 15-30 chews). When taste or tingling goes away, begin chewing again. Use until nicotine is gone (taste or tingle does not return, usually 30 minutes). Park in different areas of mouth. Nicotine is absorbed through the lining of the mouth. Use enough to control cravings, up to 24 pieces per day (if used alone). Avoid eating or drinking for 15 minutes before using and during use. Side effects may include: mouth/jaw soreness, hiccups, indigestion, hypersalivation.  If gum is not chewed correctly, additional side effects may include lightheadedness, nausea/vomiting, throat and mouth irritation.  ? Nicotine Lozenge  Allow to dissolve slowly in mouth (20-30 minutes). Do not chew or swallow. Nicotine release may cause a warm tingling sensation. Occasionally rotate to different areas of the mouth. Use enough to control cravings, up to 20 lozenges per day (if used alone). Avoid eating or drinking for 15 minutes before using and during use. Side effects may include: nausea, hiccups, cough, heartburn, headache, gas, insomnia.  ? Nicotine Nasal Spray Use 1 spray in each nostril (1 dose) and tilt head back for 1 minute. Do not sniff, swallow, or inhale through nose.  Use at least 8 doses (1 spray in each nostril) , up to 40 doses per day (if used alone). To reduce nasal irritation, spray on cotton swab and insert into nose. Side effects may include: nasal and/or throat irritation (hot, peppery, or burning sensation), nasal irritation, tearing, sneezing, cough, headache.  ? Nicotine Oral Inhaler (puffer) Inhale into the back of the throat or puff in short breaths. Do not inhale into the lungs.  Puff continuously for 20 minutes (about 80 puffs) until cartridge   is  empty. Change cartridge when it loses the "burning in throat" sensation (feels like air only). Open cartridges can be saved and used again within 24 hours. Use at least 6 and up to 16 cartridges per day (if used alone).  Avoid eating or drinking for 15 minutes before using and during use. Side effects may include: mouth and/or throat irritation, unpleasant taste, cough, nasal irritation, indigestion, hiccups, headache.  ? Chantix (varenicline) Days 1-3: Take one 0.5 mg white pill each morning for 3 days, one week before quit date. Days 4-7: Increase to one 0.5 mg white pill twice a day in morning and evening for 4 days.  On Day 8 (target quit date), increase to one 1 mg blue pill twice a day. Maintain this dose for a minimum of 3 months. Take with food and a full glass of water to reduce nausea. Be sure that the two doses are at least 8 hours apart, but try to take second dose early in the evening (i.e. 6 pm) to avoid sleep problems. Common side effects include: nausea, insomnia, headache, abnormal/vivid dreams. Tell your doctor if you have any history of psychiatric illness prior to starting Chantix.  STOP taking CHANTIX and contact a healthcare provider immediately if you experience agitation, hostility, depressed mood, changes in thoughts or behavior that are not typical for you, thinking about or attempting suicide, allergic or skin reactions including swelling, rash, redness, or peeling of the skin.  For patients who have heart disease: Smoking is a major risk factor for cardiovascular disease, and Chantix can help you quit smoking. Chantix may be associated with a small, increased risk of certain heart events in patients who have heart disease. If you have any new or worsening symptoms of heart disease while taking Chantix, such as shortness of breath or trouble breathing, new or worsening chest pain, or new or worsening pain in your legs when walking, call your doctor or get emergency medical  help immediately.  ? Wellbutrin / Zyban (bupropion) Take one 150 mg pill each morning for 3 days, one week before target quit date. On Day 4, increase to one 150 mg pill twice a day, morning and evening.  Maintain this dose for a minimum of 3 months. Be sure that the two doses are at least 8 hours apart, but try to take second dose early in the evening (i.e. 6 pm) to avoid sleep problems. Avoid or minimize use of alcohol when taking this medication. Common side effects include: dry mouth, headache, insomnia, nausea, weight loss.  Risk of seizure is 01/998. STOP taking BUPROPION and contact a healthcare provider immediately if you experience agitation, hostility, depressed mood, changes in thoughts or behavior that are not typical for you, thinking about or attempting suicide, allergic or skin reactions including swelling, rash, redness, or peeling of the skin.  

## 2022-06-19 ENCOUNTER — Ambulatory Visit: Payer: 59 | Attending: Student in an Organized Health Care Education/Training Program

## 2022-06-24 ENCOUNTER — Ambulatory Visit: Payer: 59 | Admitting: Student in an Organized Health Care Education/Training Program

## 2023-03-09 ENCOUNTER — Other Ambulatory Visit: Payer: Self-pay

## 2023-03-09 DIAGNOSIS — Z5321 Procedure and treatment not carried out due to patient leaving prior to being seen by health care provider: Secondary | ICD-10-CM | POA: Diagnosis not present

## 2023-03-09 DIAGNOSIS — K0889 Other specified disorders of teeth and supporting structures: Secondary | ICD-10-CM | POA: Insufficient documentation

## 2023-03-09 NOTE — ED Triage Notes (Signed)
 Pt reports upper and lower toothaches on right side of mouth that began earlier today.

## 2023-03-09 NOTE — ED Notes (Signed)
Called X3 no answer 

## 2023-03-10 ENCOUNTER — Emergency Department
Admission: EM | Admit: 2023-03-10 | Discharge: 2023-03-10 | Discharge status: Against Medical Advice | Payer: 59 | Attending: Emergency Medicine | Admitting: Emergency Medicine
